# Patient Record
Sex: Female | Born: 2000 | Race: Black or African American | Hispanic: No | Marital: Single | Smoking: Never smoker
Health system: Southern US, Community
[De-identification: ages and names within clinical notes are randomized; demographics above are authoritative.]

## PROBLEM LIST (undated history)

## (undated) DIAGNOSIS — N83209 Unspecified ovarian cyst, unspecified side: Secondary | ICD-10-CM

## (undated) DIAGNOSIS — A749 Chlamydial infection, unspecified: Secondary | ICD-10-CM

## (undated) DIAGNOSIS — D649 Anemia, unspecified: Secondary | ICD-10-CM

## (undated) DIAGNOSIS — A599 Trichomoniasis, unspecified: Secondary | ICD-10-CM

## (undated) DIAGNOSIS — Z202 Contact with and (suspected) exposure to infections with a predominantly sexual mode of transmission: Secondary | ICD-10-CM

## (undated) HISTORY — PX: NO PAST SURGERIES: SHX2092

## (undated) HISTORY — PX: WISDOM TOOTH EXTRACTION: SHX21

---
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
## 2018-06-09 ENCOUNTER — Other Ambulatory Visit: Payer: Self-pay

## 2018-06-09 ENCOUNTER — Ambulatory Visit (HOSPITAL_COMMUNITY)
Admission: EM | Admit: 2018-06-09 | Discharge: 2018-06-09 | Disposition: A | Discharge status: Home / Self Care | Payer: Self-pay | Attending: Family Medicine | Admitting: Family Medicine

## 2018-06-09 ENCOUNTER — Encounter (HOSPITAL_COMMUNITY): Payer: Self-pay

## 2018-06-09 DIAGNOSIS — R059 Cough, unspecified: Secondary | ICD-10-CM

## 2018-06-09 DIAGNOSIS — R05 Cough: Secondary | ICD-10-CM | POA: Insufficient documentation

## 2018-06-09 DIAGNOSIS — R0981 Nasal congestion: Secondary | ICD-10-CM | POA: Insufficient documentation

## 2018-06-09 MED ORDER — CETIRIZINE HCL 10 MG PO TABS: 10.0000 mg | ORAL_TABLET | Freq: Every day | ORAL | 0 refills | Status: DC

## 2018-06-09 MED ORDER — AZITHROMYCIN 250 MG PO TABS: 250.0000 mg | ORAL_TABLET | Freq: Every day | ORAL | 0 refills | Status: DC

## 2018-06-09 NOTE — ED Triage Notes (Signed)
Pt cc cough and chest congestion . Pt states her eyes hurt. This has been going on for a month or more.

## 2018-06-10 NOTE — ED Provider Notes (Signed)
[facility]   [id number] 06/09/18 Arrival Time: 1722  ASSESSMENT & PLAN:  1. Cough   2. Nasal congestion     Meds ordered this encounter  Medications  . azithromycin (ZITHROMAX) 250 MG tablet    Sig: Take 1 tablet (250 mg total) by mouth daily. Take first 2 tablets together, then 1 every day until finished.    Dispense:  6 tablet    Refill:  0  . cetirizine (ZYRTEC) 10 MG tablet    Sig: Take 1 tablet (10 mg total) by mouth daily.    Dispense:  30 tablet    Refill:  0   Given duration of symptoms,will treat as above. OTC symptom care as needed. Ensure adequate fluid intake and rest.  Follow-up Information    [facility].   Specialty:  Urgent Care Why:  As needed. Contact information: [address] [place] [address] [phone]          Reviewed expectations re: course of current medical issues. Questions answered. Outlined signs and symptoms indicating need for more acute intervention. Patient verbalized understanding. After Visit Summary given.   SUBJECTIVE: History from: patient.  Mary Vincent is a 18 y.o. female who presents with complaint of nasal congestion, post-nasal drainage, and sinus pain. Also reports a persistent dry cough. Onset gradual, about a month ago. Fever: no. Overall normal PO intake without n/v. OTC treatment: various cold medicines without relief.. History of frequent sinus infections: no. No specific aggravating or alleviating factors reported. Social History   Tobacco Use  Smoking Status Former Smoker  . Last attempt to quit: 10/06/2012  . Years since quitting: 5.6  Smokeless Tobacco Never Used    ROS: As per HPI.   OBJECTIVE:  Vitals:   06/09/18 1820  BP: 104/67  Pulse: 99  Resp: 16  Temp: 98.2 F (36.8 C)  SpO2: 100%  Weight: 59 kg     General appearance: alert; appears fatigued HEENT: nasal congestion; clear runny nose; throat irritation secondary  to post-nasal drainage; bilateral maxillary tenderness to palpation; turbinates boggy Neck: supple without LAD; trachea midline Lungs: unlabored respirations, symmetrical air entry; cough: moderate and dry; no respiratory distress Skin: warm and dry Psychological: alert and cooperative; normal mood and affect  No Known Allergies  Past Medical History:  Diagnosis Date  . Chlamydia contact, treated    History reviewed. No pertinent family history. Social History   Socioeconomic History  . Marital status: Single    Spouse name: Not on file  . Number of children: Not on file  . Years of education: Not on file  . Highest education level: Not on file  Occupational History  . Not on file  Social Needs  . Financial resource strain: Not on file  . Food insecurity:    Worry: Not on file    Inability: Not on file  . Transportation needs:    Medical: Not on file    Non-medical: Not on file  Tobacco Use  . Smoking status: Former Smoker    Last attempt to quit: 10/06/2012    Years since quitting: 5.6  . Smokeless tobacco: Never Used  Substance and Sexual Activity  . Alcohol use: No  . Drug use: Yes    Types: Marijuana    Comment: last use 8 months ago  . Sexual activity: Yes    Birth control/protection: Abstinence  Lifestyle  . Physical activity:    Days per week: Not on file  Minutes per session: Not on file  . Stress: Not on file  Relationships  . Social connections:    Talks on phone: Not on file    Gets together: Not on file    Attends religious service: Not on file    Active member of club or organization: Not on file    Attends meetings of clubs or organizations: Not on file    Relationship status: Not on file  . Intimate partner violence:    Fear of current or ex partner: Not on file    Emotionally abused: Not on file    Physically abused: Not on file    Forced sexual activity: Not on file  Other Topics Concern  . Not on file  Social History Narrative  . Not  on file            Mardella Layman, MD 06/10/18 [id number]

## 2019-03-15 ENCOUNTER — Encounter (HOSPITAL_COMMUNITY): Payer: Self-pay | Admitting: Emergency Medicine

## 2019-03-15 ENCOUNTER — Other Ambulatory Visit: Payer: Self-pay

## 2019-03-15 ENCOUNTER — Ambulatory Visit (HOSPITAL_COMMUNITY)
Admission: EM | Admit: 2019-03-15 | Discharge: 2019-03-15 | Disposition: A | Discharge status: Home / Self Care | Payer: Medicaid Other | Attending: Family Medicine | Admitting: Family Medicine

## 2019-03-15 DIAGNOSIS — M545 Low back pain, unspecified: Secondary | ICD-10-CM

## 2019-03-15 DIAGNOSIS — M25571 Pain in right ankle and joints of right foot: Secondary | ICD-10-CM

## 2019-03-15 MED ORDER — IBUPROFEN 800 MG PO TABS: 800.0000 mg | ORAL_TABLET | Freq: Three times a day (TID) | ORAL | 0 refills | Status: DC

## 2019-03-15 MED ORDER — TIZANIDINE HCL 4 MG PO TABS: 4.0000 mg | ORAL_TABLET | Freq: Four times a day (QID) | ORAL | 0 refills | Status: DC | PRN
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
## 2019-03-15 NOTE — Discharge Instructions (Signed)
Take ibuprofen 3 times a day with food for any muscular pain Take muscle relaxer as needed.  Be sure you take 1 before you go to bed tonight Ice or heat to painful areas Activity as tolerated Stay home tomorrow if you wake up feeling stiff and sore Expect improvement over the next several days to week

## 2019-03-15 NOTE — ED Notes (Signed)
No answer
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
## 2019-05-12 ENCOUNTER — Telehealth: Payer: Medicaid Other | Admitting: Family

## 2019-05-12 DIAGNOSIS — B9689 Other specified bacterial agents as the cause of diseases classified elsewhere: Secondary | ICD-10-CM

## 2019-05-12 DIAGNOSIS — N760 Acute vaginitis: Secondary | ICD-10-CM | POA: Diagnosis not present

## 2019-05-12 MED ORDER — METRONIDAZOLE 500 MG PO TABS: 500.0000 mg | ORAL_TABLET | Freq: Two times a day (BID) | ORAL | 0 refills | Status: DC

## 2019-05-12 NOTE — Progress Notes (Signed)

## 2019-06-19 ENCOUNTER — Other Ambulatory Visit: Payer: Self-pay

## 2019-06-19 ENCOUNTER — Encounter (HOSPITAL_COMMUNITY): Payer: Self-pay

## 2019-06-19 ENCOUNTER — Ambulatory Visit (HOSPITAL_COMMUNITY)
Admission: EM | Admit: 2019-06-19 | Discharge: 2019-06-19 | Disposition: A | Discharge status: Home / Self Care | Payer: Medicaid Other | Attending: Family Medicine | Admitting: Family Medicine

## 2019-06-19 DIAGNOSIS — T23201A Burn of second degree of right hand, unspecified site, initial encounter: Secondary | ICD-10-CM

## 2019-06-19 NOTE — ED Notes (Signed)
No answer

## 2019-06-19 NOTE — ED Triage Notes (Signed)
Pt states she has a burn on her right wrist at home cooking chicken. Pt needs a work note.
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
## 2019-06-27 ENCOUNTER — Telehealth: Payer: Medicaid Other | Admitting: Family

## 2019-06-27 DIAGNOSIS — N760 Acute vaginitis: Secondary | ICD-10-CM

## 2019-06-27 DIAGNOSIS — B9689 Other specified bacterial agents as the cause of diseases classified elsewhere: Secondary | ICD-10-CM

## 2019-06-27 MED ORDER — METRONIDAZOLE 500 MG PO TABS: 500.0000 mg | ORAL_TABLET | Freq: Two times a day (BID) | ORAL | 0 refills | Status: DC

## 2019-06-27 NOTE — Progress Notes (Signed)
We are sorry that you are not feeling well. Here is how we plan to help! Based on what you shared with me it looks like you: May have a vaginosis due to bacteria  Vaginosis is an inflammation of the vagina that can result in discharge, itching and pain. The cause is usually a change in the normal balance of vaginal bacteria or an infection. Vaginosis can also result from reduced estrogen levels after menopause.  The most common causes of vaginosis are:   Bacterial vaginosis which results from an overgrowth of one on several organisms that are normally present in your vagina.   Yeast infections which are caused by a naturally occurring fungus called candida.   Vaginal atrophy (atrophic vaginosis) which results from the thinning of the vagina from reduced estrogen levels after menopause.   Trichomoniasis which is caused by a parasite and is commonly transmitted by sexual intercourse.  Factors that increase your risk of developing vaginosis include: Marland Kitchen Medications, such as antibiotics and steroids . Uncontrolled diabetes . Use of hygiene products such as bubble bath, vaginal spray or vaginal deodorant . Douching . Wearing damp or tight-fitting clothing . Using an intrauterine device (IUD) for birth control . Hormonal changes, such as those associated with pregnancy, birth control pills or menopause . Sexual activity . Having a sexually transmitted infection  Your treatment plan is Metronidazole or Flagyl 500mg  twice a day for 7 days.  I have electronically sent this prescription into the pharmacy that you have chosen.   If your symptoms return or do not improve you will need to be face to face.   Be sure to take all of the medication as directed. Stop taking any medication if you develop a rash, tongue swelling or shortness of breath. Mothers who are breast feeding should consider pumping and discarding their breast milk while on these antibiotics. However, there is no consensus that infant  exposure at these doses would be harmful.  Remember that medication creams can weaken latex condoms. Marland Kitchen   HOME CARE:  Good hygiene may prevent some types of vaginosis from recurring and may relieve some symptoms:  . Avoid baths, hot tubs and whirlpool spas. Rinse soap from your outer genital area after a shower, and dry the area well to prevent irritation. Don't use scented or harsh soaps, such as those with deodorant or antibacterial action. Marland Kitchen Avoid irritants. These include scented tampons and pads. . Wipe from front to back after using the toilet. Doing so avoids spreading fecal bacteria to your vagina.  Other things that may help prevent vaginosis include:  Marland Kitchen Don't douche. Your vagina doesn't require cleansing other than normal bathing. Repetitive douching disrupts the normal organisms that reside in the vagina and can actually increase your risk of vaginal infection. Douching won't clear up a vaginal infection. . Use a latex condom. Both female and female latex condoms may help you avoid infections spread by sexual contact. . Wear cotton underwear. Also wear pantyhose with a cotton crotch. If you feel comfortable without it, skip wearing underwear to bed. Yeast thrives in [employer] Your symptoms should improve in the next day or two.  GET HELP RIGHT AWAY IF:  . You have pain in your lower abdomen ( pelvic area or over your ovaries) . You develop nausea or vomiting . You develop a fever . Your discharge changes or worsens . You have persistent pain with intercourse . You develop shortness of breath, a rapid pulse, or you faint.  These  symptoms could be signs of problems or infections that need to be evaluated by a medical provider now.  MAKE SURE YOU    Understand these instructions.  Will watch your condition.  Will get help right away if you are not doing well or get worse.  Your e-visit answers were reviewed by a board certified advanced clinical practitioner to  complete your personal care plan. Depending upon the condition, your plan could have included both over the counter or prescription medications. Please review your pharmacy choice to make sure that you have choses a pharmacy that is open for you to pick up any needed prescription, Your safety is important to [place]. If you have drug allergies check your prescription carefully.   You can use MyChart to ask questions about today's visit, request a non-urgent call back, or ask for a work or school excuse for 24 hours related to this e-Visit. If it has been greater than 24 hours you will need to follow up with your provider, or enter a new e-Visit to address those concerns. You will get a MyChart message within the next two days asking about your experience. I hope that your e-visit has been valuable and will speed your recovery.

## 2019-07-11 ENCOUNTER — Other Ambulatory Visit: Payer: Self-pay | Admitting: Family

## 2019-07-11 NOTE — Progress Notes (Signed)
Approximately 5 minutes was spent documenting and reviewing patient's chart.   

## 2019-09-25 ENCOUNTER — Telehealth: Payer: Medicaid Other | Admitting: Physician Assistant

## 2019-09-25 DIAGNOSIS — R05 Cough: Secondary | ICD-10-CM | POA: Diagnosis not present

## 2019-09-25 DIAGNOSIS — R0981 Nasal congestion: Secondary | ICD-10-CM

## 2019-09-25 DIAGNOSIS — J029 Acute pharyngitis, unspecified: Secondary | ICD-10-CM

## 2019-09-25 DIAGNOSIS — R059 Cough, unspecified: Secondary | ICD-10-CM

## 2019-09-25 MED ORDER — BENZONATATE 100 MG PO CAPS: 100.0000 mg | ORAL_CAPSULE | Freq: Three times a day (TID) | ORAL | 0 refills | Status: DC | PRN

## 2019-09-25 MED ORDER — PHENOL 1.4 % MT LIQD: 1.0000 | OROMUCOSAL | 0 refills | Status: DC | PRN

## 2019-09-25 MED ORDER — FLUTICASONE PROPIONATE 50 MCG/ACT NA SUSP: 2.0000 | Freq: Every day | NASAL | 0 refills | Status: DC

## 2019-09-25 NOTE — Progress Notes (Signed)
We are sorry you are not feeling well.  Here is how we plan to help!  Based on what you have shared with me, it looks like you may have a viral upper respiratory infection.  Upper respiratory infections are caused by a large number of viruses; however, rhinovirus is the most common cause.   Symptoms vary from person to person, with common symptoms including sore throat, cough, fatigue or lack of energy and feeling of general discomfort.  A low-grade fever of up to 100.4 may present, but is often uncommon.  Symptoms vary however, and are closely related to a person's age or underlying illnesses.  The most common symptoms associated with an upper respiratory infection are nasal discharge or congestion, cough, sneezing, headache and pressure in the ears and face.  These symptoms usually persist for about 3 to 10 days, but can last up to 2 weeks.  It is important to know that upper respiratory infections do not cause serious illness or complications in most cases.    Upper respiratory infections can be transmitted from person to person, with the most common method of transmission being a person's hands.  The virus is able to live on the skin and can infect other persons for up to 2 hours after direct contact.  Also, these can be transmitted when someone coughs or sneezes; thus, it is important to cover the mouth to reduce this risk.  To keep the spread of the illness at [place], good hand hygiene is very important.  This is an infection that is most likely caused by a virus. There are no specific treatments other than to help you with the symptoms until the infection runs its course.  We are sorry you are not feeling well.  Here is how we plan to help!   For nasal congestion, you may use an oral decongestants such as Mucinex D or if you have glaucoma or high blood pressure use plain Mucinex.  Saline nasal spray or nasal drops can help and can safely be used as often as needed for congestion.  For your congestion,  I have prescribed Fluticasone nasal spray one spray in each nostril twice a day  If you do not have a history of heart disease, hypertension, diabetes or thyroid disease, prostate/bladder issues or glaucoma, you may also use Sudafed to treat nasal congestion.  It is highly recommended that you consult with a pharmacist or your primary care physician to ensure this medication is safe for you to take.     If you have a cough, you may use cough suppressants such as Delsym and Robitussin.  If you have glaucoma or high blood pressure, you can also use Coricidin HBP.   For cough I have prescribed for you A prescription cough medication called Tessalon Perles 100 mg. You may take 1-2 capsules every 8 hours as needed for cough  For sore or scratchy throat, use a saltwater gargle-  to  teaspoon of salt dissolved in a 4-ounce to 8-ounce glass of warm water.  Gargle the solution for approximately 15-30 seconds and then spit.  It is important not to swallow the solution.    You can also use throat lozenges/cough drops and Chloraseptic spray (which I have prescribed for you) to help with throat pain or discomfort.  Warm or cold liquids can also be helpful in relieving throat pain.  For headache, pain or general discomfort, you can use Ibuprofen or Tylenol as directed.   Some authorities believe that zinc  sprays or the use of Echinacea may shorten the course of your symptoms.   HOME CARE . Only take medications as instructed by your medical team. . Be sure to drink plenty of fluids. Water is fine as well as fruit juices, sodas and electrolyte beverages. You may want to stay away from caffeine or alcohol. If you are nauseated, try taking small sips of liquids. How do you know if you are getting enough fluid? Your urine should be a pale yellow or almost colorless. . Get rest. . Taking a steamy shower or using a humidifier may help nasal congestion and ease sore throat pain. You can place a towel over your head  and breathe in the steam from hot water coming from a faucet. . Using a saline nasal spray works much the same way. . Cough drops, hard candies and sore throat lozenges may ease your cough. . Avoid close contacts especially the very young and the elderly . Cover your mouth if you cough or sneeze . Always remember to wash your hands.   GET HELP RIGHT AWAY IF: . You develop worsening fever. . If your symptoms do not improve within 10 days . You develop yellow or green discharge from your nose over 3 days. . You have coughing fits . You develop a severe head ache or visual changes. . You develop shortness of breath, difficulty breathing or start having chest pain . Your symptoms persist after you have completed your treatment plan  MAKE SURE YOU   Understand these instructions.  Will watch your condition.  Will get help right away if you are not doing well or get worse.  Your e-visit answers were reviewed by a board certified advanced clinical practitioner to complete your personal care plan. Depending upon the condition, your plan could have included both over the counter or prescription medications. Please review your pharmacy choice. If there is a problem, you may call our nursing hot line at and have the prescription routed to another pharmacy. Your safety is important to [place]. If you have drug allergies check your prescription carefully.   You can use MyChart to ask questions about today's visit, request a non-urgent call back, or ask for a work or school excuse for 24 hours related to this e-Visit. If it has been greater than 24 hours you will need to follow up with your provider, or enter a new e-Visit to address those concerns. You will get an e-mail in the next two days asking about your experience.  I hope that your e-visit has been valuable and will speed your recovery. Thank you for using e-visits.  Greater than 5 minutes, yet less than 10 minutes of time have been spent  researching, coordinating, and implementing care for this patient today.

## 2019-10-29 ENCOUNTER — Telehealth: Payer: Medicaid Other

## 2019-11-20 ENCOUNTER — Telehealth: Payer: Medicaid Other | Admitting: Nurse Practitioner

## 2019-11-20 DIAGNOSIS — B9689 Other specified bacterial agents as the cause of diseases classified elsewhere: Secondary | ICD-10-CM

## 2019-11-20 DIAGNOSIS — N760 Acute vaginitis: Secondary | ICD-10-CM

## 2019-11-20 MED ORDER — METRONIDAZOLE 500 MG PO TABS: 500.0000 mg | ORAL_TABLET | Freq: Two times a day (BID) | ORAL | 0 refills | Status: DC

## 2019-11-20 NOTE — Progress Notes (Signed)

## 2020-05-04 ENCOUNTER — Telehealth: Payer: Medicaid Other | Admitting: Physician Assistant

## 2020-05-04 ENCOUNTER — Encounter: Payer: Self-pay | Admitting: Physician Assistant

## 2020-05-04 DIAGNOSIS — R21 Rash and other nonspecific skin eruption: Secondary | ICD-10-CM

## 2020-05-04 MED ORDER — TRIAMCINOLONE ACETONIDE 0.1 % EX CREA: 1.0000 "application " | TOPICAL_CREAM | Freq: Two times a day (BID) | CUTANEOUS | 0 refills | Status: DC

## 2020-05-04 NOTE — Progress Notes (Signed)
E Visit for Rash  We are sorry that you are not feeling well. Here is how we plan to help!  I have prescribed steroid cream, triamcinolone 0.1%  Apply thin layer twice daily for 5 days. Avoid overuse of the cream to avoid skin thinning. HOME CARE:   Take cool showers and avoid direct sunlight.  Apply cool compress or wet dressings.  Take a bath in an oatmeal bath.  Sprinkle content of one Aveeno packet under running faucet with comfortably warm water.  Bathe for 15-20 minutes, 1-2 times daily.  Pat dry with a towel. Do not rub the rash.  Use hydrocortisone cream.  Take an antihistamine like Benadryl for widespread rashes that itch.  The adult dose of Benadryl is 25-50 mg by mouth 4 times daily.  Caution:  This type of medication may cause sleepiness.  Do not drink alcohol, drive, or operate dangerous machinery while taking antihistamines.  Do not take these medications if you have prostate enlargement.  Read package instructions thoroughly on all medications that you take.  GET HELP RIGHT AWAY IF:   Symptoms don't go away after treatment.  Severe itching that persists.  If you rash spreads or swells.  If you rash begins to smell.  If it blisters and opens or develops a yellow-brown crust.  You develop a fever.  You have a sore throat.  You become short of breath.  MAKE SURE YOU:  Understand these instructions. Will watch your condition. Will get help right away if you are not doing well or get worse.  Thank you for choosing an e-visit. Your e-visit answers were reviewed by a board certified advanced clinical practitioner to complete your personal care plan. Depending upon the condition, your plan could have included both over the counter or prescription medications. Please review your pharmacy choice. Be sure that the pharmacy you have chosen is open so that you can pick up your prescription now.  If there is a problem you may message your provider in MyChart to have  the prescription routed to another pharmacy. Your safety is important to [place]. If you have drug allergies check your prescription carefully.  For the next 24 hours, you can use MyChart to ask questions about today's visit, request a non-urgent call back, or ask for a work or school excuse from your e-visit provider. You will get an email in the next two days asking about your experience. I hope that your e-visit has been valuable and will speed your recovery.     I spent 5-10 minutes on review and completion of this note- Illa Level [facility]

## 2020-05-12 ENCOUNTER — Inpatient Hospital Stay (HOSPITAL_COMMUNITY)
Admission: AD | Admit: 2020-05-12 | Discharge: 2020-05-12 | Disposition: A | Discharge status: Home / Self Care | Payer: Medicaid Other | Attending: Family Medicine | Admitting: Family Medicine

## 2020-05-12 ENCOUNTER — Other Ambulatory Visit: Payer: Self-pay

## 2020-05-12 ENCOUNTER — Inpatient Hospital Stay (HOSPITAL_COMMUNITY): Payer: Medicaid Other

## 2020-05-12 ENCOUNTER — Encounter (HOSPITAL_COMMUNITY): Payer: Self-pay | Admitting: Obstetrics and Gynecology

## 2020-05-12 DIAGNOSIS — R102 Pelvic and perineal pain: Secondary | ICD-10-CM

## 2020-05-12 DIAGNOSIS — O26899 Other specified pregnancy related conditions, unspecified trimester: Secondary | ICD-10-CM

## 2020-05-12 DIAGNOSIS — Z3A01 Less than 8 weeks gestation of pregnancy: Secondary | ICD-10-CM | POA: Insufficient documentation

## 2020-05-12 DIAGNOSIS — R109 Unspecified abdominal pain: Secondary | ICD-10-CM | POA: Insufficient documentation

## 2020-05-12 DIAGNOSIS — Z87891 Personal history of nicotine dependence: Secondary | ICD-10-CM | POA: Diagnosis not present

## 2020-05-12 DIAGNOSIS — N898 Other specified noninflammatory disorders of vagina: Secondary | ICD-10-CM | POA: Diagnosis not present

## 2020-05-12 DIAGNOSIS — O26891 Other specified pregnancy related conditions, first trimester: Secondary | ICD-10-CM | POA: Insufficient documentation

## 2020-05-12 DIAGNOSIS — Z3490 Encounter for supervision of normal pregnancy, unspecified, unspecified trimester: Secondary | ICD-10-CM

## 2020-05-12 HISTORY — DX: Trichomoniasis, unspecified: A59.9

## 2020-05-12 HISTORY — DX: Anemia, unspecified: D64.9

## 2020-05-12 HISTORY — DX: Chlamydial infection, unspecified: A74.9

## 2020-05-12 HISTORY — DX: Unspecified ovarian cyst, unspecified side: N83.209

## 2020-05-12 LAB — URINALYSIS, ROUTINE W REFLEX MICROSCOPIC
Bilirubin Urine: NEGATIVE
Glucose, UA: NEGATIVE mg/dL
Hgb urine dipstick: NEGATIVE
Ketones, ur: 20 mg/dL — AB
Leukocytes,Ua: NEGATIVE
Nitrite: NEGATIVE
Protein, ur: NEGATIVE mg/dL
Specific Gravity, Urine: 1.026 (ref 1.005–1.030)
pH: 5 (ref 5.0–8.0)

## 2020-05-12 LAB — CBC
HCT: 34.6 % — ABNORMAL LOW (ref 36.0–46.0)
Hemoglobin: 11.4 g/dL — ABNORMAL LOW (ref 12.0–15.0)
MCH: 23.8 pg — ABNORMAL LOW (ref 26.0–34.0)
MCHC: 32.9 g/dL (ref 30.0–36.0)
MCV: 72.2 fL — ABNORMAL LOW (ref 80.0–100.0)
Platelets: 253 10*3/uL (ref 150–400)
RBC: 4.79 MIL/uL (ref 3.87–5.11)
RDW: 17.3 % — ABNORMAL HIGH (ref 11.5–15.5)
WBC: 5.6 10*3/uL (ref 4.0–10.5)
nRBC: 0 % (ref 0.0–0.2)

## 2020-05-12 LAB — HCG, QUANTITATIVE, PREGNANCY: hCG, Beta Chain, Quant, S: 8407 m[IU]/mL — ABNORMAL HIGH (ref <5)

## 2020-05-12 LAB — WET PREP, GENITAL
Sperm: NONE SEEN
Trich, Wet Prep: NONE SEEN
WBC, Wet Prep HPF POC: NONE SEEN
Yeast Wet Prep HPF POC: NONE SEEN

## 2020-05-12 LAB — ABO/RH: ABO/RH(D): O POS

## 2020-05-12 LAB — HIV ANTIBODY (ROUTINE TESTING W REFLEX): HIV Screen 4th Generation wRfx: NONREACTIVE

## 2020-05-12 LAB — POCT PREGNANCY, URINE: Preg Test, Ur: POSITIVE — AB

## 2020-05-12 MED ORDER — PREPLUS 27-1 MG PO TABS: 1.0000 | ORAL_TABLET | Freq: Every day | ORAL | 13 refills | Status: DC

## 2020-05-12 NOTE — Discharge Instructions (Signed)
Return to MAU:  If you have heavy bleeding that soaks through more that 2 pads per hour for an hour or more  If you bleed so much that you feel like you might pass out or you do pass out  If you have significant abdominal pain that is not improved with Tylenol 1000 mg every 6 hours as needed for pain  If you develop a fever > 100.5  Someone from [facility] ultrasound department will call you to schedule repeat u/s in 2 wks.  Safe Medications in Pregnancy   Acne: Benzoyl Peroxide Salicylic Acid  Backache/Headache: Tylenol: 2 regular strength every 4 hours OR              2 Extra strength every 6 hours  Colds/Coughs/Allergies: Benadryl (alcohol free) 25 mg every 6 hours as needed Breath right strips Claritin Cepacol throat lozenges Chloraseptic throat spray Cold-Eeze- up to three times per day Cough drops, alcohol free Flonase (by prescription only) Guaifenesin Mucinex Robitussin DM (plain only, alcohol free) Saline nasal spray/drops Sudafed (pseudoephedrine) & Actifed ** use only after [redacted] weeks gestation and if you do not have high blood pressure Tylenol Vicks Vaporub Zinc lozenges Zyrtec   Constipation: Colace Ducolax suppositories Fleet enema Glycerin suppositories Metamucil Milk of magnesia Miralax Senokot Smooth move tea  Diarrhea: Kaopectate Imodium A-D  *NO pepto Bismol  Hemorrhoids: Anusol Anusol HC Preparation H Tucks  Indigestion: Tums Maalox Mylanta Zantac  Pepcid  Insomnia: Benadryl (alcohol free) 25mg  every 6 hours as needed Tylenol PM Unisom, no Gelcaps  Leg Cramps: Tums MagGel  Nausea/Vomiting:  Bonine Dramamine Emetrol Ginger extract Sea bands Meclizine  Nausea medication to take during pregnancy:  Unisom (doxylamine succinate 25 mg tablets) Take one tablet daily at bedtime. If symptoms are not adequately controlled, the dose can be increased to a maximum recommended dose of two tablets daily (1/2 tablet  in the morning, 1/2 tablet mid-afternoon and one at bedtime). Vitamin B6 100mg  tablets. Take one tablet twice a day (up to 200 mg per day).  Skin Rashes: Aveeno products Benadryl cream or 25mg  every 6 hours as needed Calamine Lotion 1% cortisone cream  Yeast infection: Gyne-lotrimin 7 Monistat 7   **If taking multiple medications, please check labels to avoid duplicating the same active ingredients **take medication as directed on the label ** Do not exceed 4000 mg of tylenol in 24 hours **Do not take medications that contain aspirin or ibuprofen

## 2020-05-12 NOTE — MAU Note (Signed)
+  HPT yesterday, has been drinking and stuff, wants to know if everything is ok with the baby.  Has been cramping in the stomach every day.

## 2020-05-12 NOTE — MAU Provider Note (Signed)
History     CSN: [id number]  Arrival date and time: 05/12/20 1300   First Provider Initiated Contact with Patient 05/12/20 1746      Chief Complaint  Patient presents with  . Abdominal Pain  . Possible Pregnancy   Ms. Mary Vincent is a 19 y.o. year old G72P0000 female at [redacted]w[redacted]d weeks gestation who presents to MAU reporting (+) HPT and daily abdominal cramping. She reports she has been "drinking and stuff, so I wanted to make sure the baby is ok." She denies VB, but reports some vaginal discharge.   OB History    Gravida  1   Para  0   Term  0   Preterm  0   AB  0   Living  0     SAB  0   TAB  0   Ectopic  0   Multiple  0   Live Births              Past Medical History:  Diagnosis Date  . Anemia   . Chlamydia   . Chlamydia contact, treated   . Ovarian cyst   . Trichomonas infection     Past Surgical History:  Procedure Laterality Date  . NO PAST SURGERIES    . WISDOM TOOTH EXTRACTION      Family History  Problem Relation Age of Onset  . Healthy Mother   . Healthy Father     Social History   Tobacco Use  . Smoking status: Former Smoker    Quit date: 10/06/2012    Years since quitting: 7.6  . Smokeless tobacco: Never Used  Vaping Use  . Vaping Use: Never used  Substance Use Topics  . Alcohol use: Yes    Comment: weekends  . Drug use: Yes    Frequency: 7.0 times per week    Types: Marijuana    Comment: last was 12/4    Allergies: No Known Allergies  Medications Prior to Admission  Medication Sig Dispense Refill Last Dose  . benzonatate (TESSALON) 100 MG capsule Take 1 capsule (100 mg total) by mouth 3 (three) times daily as needed for cough. 21 capsule 0   . fluticasone (FLONASE) 50 MCG/ACT nasal spray Place 2 sprays into both nostrils daily. 16 g 0   . ibuprofen (ADVIL) 800 MG tablet Take 1 tablet (800 mg total) by mouth 3 (three) times daily. 21 tablet 0   . metroNIDAZOLE (FLAGYL) 500 MG tablet Take 1 tablet (500 mg total) by  mouth 2 (two) times daily. 14 tablet 0   . phenol (CHLORASEPTIC) 1.4 % LIQD Use as directed 1 spray in the mouth or throat as needed for throat irritation / pain. 29 mL 0   . tiZANidine (ZANAFLEX) 4 MG tablet Take 1-2 tablets (4-8 mg total) by mouth every 6 (six) hours as needed for muscle spasms. 21 tablet 0   . triamcinolone (KENALOG) 0.1 % Apply 1 application topically 2 (two) times daily. 30 g 0     Review of Systems  Constitutional: Negative.   HENT: Negative.   Eyes: Negative.   Respiratory: Negative.   Cardiovascular: Negative.   Gastrointestinal: Negative.   Endocrine: Negative.   Genitourinary: Positive for pelvic pain and vaginal discharge.  Musculoskeletal: Negative.   Skin: Negative.   Allergic/Immunologic: Negative.   Neurological: Negative.   Hematological: Negative.   Psychiatric/Behavioral: Negative.    Physical Exam   Blood pressure 118/69, pulse 60, temperature 97.9 F (36.6 C), temperature source Oral,  resp. rate 16, height 5\' 5"  (1.651 m), weight 57.3 kg, last menstrual period 04/09/2020, SpO2 100 %.  Physical Exam Vitals and nursing note reviewed. Exam conducted with a chaperone present.  Constitutional:      Appearance: Normal appearance. She is normal weight.  HENT:     Head: Normocephalic and atraumatic.  Cardiovascular:     Rate and Rhythm: Normal rate.     Pulses: Normal pulses.  Pulmonary:     Effort: Pulmonary effort is normal.  Abdominal:     General: Abdomen is flat.     Palpations: Abdomen is soft.  Genitourinary:    Comments: Uterus: non-tender, SE: cervix is smooth, pink, no lesions, moderate amt of thin, white vaginal d/c -- WP, GC/CT done, closed/long/firm, no CMT or friability, no adnexal tenderness  Musculoskeletal:        General: Normal range of motion.     Cervical back: Normal range of motion.  Skin:    General: Skin is warm and dry.  Neurological:     Mental Status: She is alert and oriented to person, place, and time.   Psychiatric:        Mood and Affect: Mood normal.        Behavior: Behavior normal.        Thought Content: Thought content normal.        Judgment: Judgment normal.    MAU Course  Procedures  MDM CCUA UPT CBC ABO/Rh HCG Wet Prep GC/CT -- pending HIV -- pending OB < 14 wks 13/08/2019 with TV  Results for orders placed or performed during the hospital encounter of 05/12/20 (from the past 24 hour(s))  Urinalysis, Routine w reflex microscopic Urine, Clean Catch     Status: Abnormal   Collection Time: 05/12/20  2:49 PM  Result Value Ref Range   Color, Urine YELLOW YELLOW   APPearance HAZY (A) CLEAR   Specific Gravity, Urine 1.026 1.005 - 1.030   pH 5.0 5.0 - 8.0   Glucose, UA NEGATIVE NEGATIVE mg/dL   Hgb urine dipstick NEGATIVE NEGATIVE   Bilirubin Urine NEGATIVE NEGATIVE   Ketones, ur 20 (A) NEGATIVE mg/dL   Protein, ur NEGATIVE NEGATIVE mg/dL   Nitrite NEGATIVE NEGATIVE   Leukocytes,Ua NEGATIVE NEGATIVE  Pregnancy, urine POC     Status: Abnormal   Collection Time: 05/12/20  2:53 PM  Result Value Ref Range   Preg Test, Ur POSITIVE (A) NEGATIVE  ABO/Rh     Status: None   Collection Time: 05/12/20  3:39 PM  Result Value Ref Range   ABO/RH(D) O POS    No rh immune globuloin      NOT A RH IMMUNE GLOBULIN CANDIDATE, PT RH POSITIVE Performed at [facility], [address] [place], [place]   CBC     Status: Abnormal   Collection Time: 05/12/20  3:40 PM  Result Value Ref Range   WBC 5.6 4.0 - 10.5 K/uL   RBC 4.79 3.87 - 5.11 MIL/uL   Hemoglobin 11.4 (L) 12.0 - 15.0 g/dL   HCT 14/06/21 (L) 36 - 46 %   MCV 72.2 (L) 80.0 - 100.0 fL   MCH 23.8 (L) 26.0 - 34.0 pg   MCHC 32.9 30.0 - 36.0 g/dL   RDW 52.8 (H) 41.3 - 24.4 %   Platelets 253 150 - 400 K/uL   nRBC 0.0 0.0 - 0.2 %  hCG, quantitative, pregnancy     Status: Abnormal   Collection Time: 05/12/20  3:40  PM  Result Value Ref Range   hCG, Beta Chain, Quant, S 8,407 (H) <5 mIU/mL  HIV Antibody (routine  testing w rflx)     Status: None   Collection Time: 05/12/20  3:40 PM  Result Value Ref Range   HIV Screen 4th Generation wRfx Non Reactive Non Reactive  Wet prep, genital     Status: Abnormal   Collection Time: 05/12/20  5:55 PM   Specimen: PATH Cytology Cervicovaginal Ancillary Only  Result Value Ref Range   Yeast Wet Prep HPF POC NONE SEEN NONE SEEN   Trich, Wet Prep NONE SEEN NONE SEEN   Clue Cells Wet Prep HPF POC PRESENT (A) NONE SEEN   WBC, Wet Prep HPF POC NONE SEEN NONE SEEN   Sperm NONE SEEN     US OB LESS THAN 14 WEEKS WITH OB TRANSVAGINAL  Result Date: 05/12/2020 CLINICAL DATA:  Initial evaluation for acute pelvic pain, early pregnancy. EXAM: OBSTETRIC <14 WK [place] AND TRANSVAGINAL OB US TECHNIQUE: Both transabdominal and transvaginal ultrasound examinations were performed for complete evaluation of the gestation as well as the maternal uterus, adnexal regions, and pelvic cul-de-sac. Transvaginal technique was performed to assess early pregnancy. COMPARISON:  None. FINDINGS: Intrauterine gestational sac: Single Yolk sac:  Present Embryo:  Negative. Cardiac Activity: Negative. Heart Rate: N/A MSD: 6.0 mm   5 w   2 d Subchorionic hemorrhage:  None visualized. Maternal uterus/adnexae: Ovaries within normal limits. No adnexal mass. Trace free fluid noted within the pelvic cul-de-sac. IMPRESSION: 1. Early intrauterine gestational sac with internal yolk sac, but no fetal pole or cardiac activity yet visualized. Recommend follow-up quantitative B-HCG levels and follow-up US in 14 days to confirm and assess viability. 2. No other acute maternal uterine or adnexal abnormality identified. Electronically Signed   By: Rise Mu M.D.   On: 05/12/2020 18:30     Assessment and Plan  Intrauterine pregnancy  - US OB Transvaginal to check viability in 2 wks - Information provided on prenatal care    Abdominal cramping affecting pregnancy   - Information provided on abdominal pain in  pregnancy - Advised to take Tylenol 1000 mg every 6 hours prn pain  - Discharge home - Someone will call to schedule rpt U/S in 2 wks - Patient verbalized an understanding of the plan of care and agrees.    Raelyn Mora, CNM 05/12/2020, 5:46 PM

## 2020-05-13 LAB — GC/CHLAMYDIA PROBE AMP (~~LOC~~) NOT AT ARMC
Chlamydia: NEGATIVE
Comment: NEGATIVE
Comment: NORMAL
Neisseria Gonorrhea: NEGATIVE

## 2020-06-04 ENCOUNTER — Other Ambulatory Visit: Payer: Self-pay

## 2020-06-04 ENCOUNTER — Telehealth: Payer: Self-pay | Admitting: Medical

## 2020-06-04 ENCOUNTER — Ambulatory Visit
Admission: RE | Admit: 2020-06-04 | Discharge: 2020-06-04 | Disposition: A | Discharge status: Home / Self Care | Payer: Medicaid Other | Source: Ambulatory Visit | Attending: Obstetrics and Gynecology | Admitting: Obstetrics and Gynecology

## 2020-06-04 DIAGNOSIS — Z3490 Encounter for supervision of normal pregnancy, unspecified, unspecified trimester: Secondary | ICD-10-CM | POA: Insufficient documentation

## 2020-06-04 NOTE — Telephone Encounter (Signed)
I called Mary Vincent today at 8:26 AM and confirmed patient's identity using two patient identifiers. [place] results from earlier today were reviewed. Patient is not yet scheduled for new OB visit. She would like to come to the [facility]. Inbasket message sent to schedule new OB in 2-4 weeks. First trimester warning signs reviewed. Patient voiced understanding and had no further questions.   US OB Transvaginal  Result Date: 06/04/2020 CLINICAL DATA:  19 year old female is pregnant in the 1st trimester. Early intrauterine gestational sac suspected on 05/12/2020. estimated gestational age by LMP 8 weeks 0 days. EXAM: TRANSVAGINAL OB ULTRASOUND TECHNIQUE: Transvaginal ultrasound was performed for complete evaluation of the gestation as well as the maternal uterus, adnexal regions, and pelvic cul-de-sac. COMPARISON:  05/12/2020. FINDINGS: Intrauterine gestational sac: Single Yolk sac:  Visible Embryo:  Visible Cardiac Activity: Detected Heart Rate: 159 bpm CRL:   17.3 mm   8 w 2 d                  [place] EDC: 01/12/2021 Subchorionic hemorrhage:  Trace (image 14). Maternal uterus/adnexae: No pelvic free fluid. Both ovaries appear normal. The right ovary is 3.7 x 2.0 by 3.7 cm with multiple small follicles. The left ovary is 4.1 by 1.8 x 2.1 with several small follicles. IMPRESSION: Single living IUP demonstrated with estimated gestational age by crown-rump length 8 weeks 2 days. Trace subchorionic hemorrhage suspected. Electronically Signed   By: Odessa Fleming M.D.   On: 06/04/2020 08:15    Vonzella Nipple, PA-C 06/04/2020 8:26 AM

## 2020-06-07 NOTE — L&D Delivery Note (Signed)
[facility] Delivery Note  Mary Vincent is a 20 y.o. G1P0000 s/p SVD at [redacted]w[redacted]d. She was admitted for labor.   ROM: 5h 22m with clear fluid GBS Status: Positive, received penicillin Maximum Maternal Temperature: 98.3  Labor Progress: Admitted in labor with SROM after admission and progressed to complete without other augmentation  Delivery Date/Time: 01/01/2021 @0741  Delivery: Called to room and patient was complete and pushing. Head delivered left OA. No nuchal cord present. Shoulder and body delivered in usual fashion. Infant with spontaneous cry, placed on mother's abdomen, dried and stimulated. Cord clamped x 2 after 1-minute delay, and cut by FOB. Cord blood drawn. Placenta delivered spontaneously with gentle cord traction. Fundus firm with massage and Pitocin. Labia, perineum, vagina, and cervix inspected inspected with no lacerations.   Placenta: intact with 3 vessels Complications: none Lacerations: none EBL: 300 Analgesia: epidural  Postpartum Planning [ ]  message to sent to schedule follow-up  [ ]  vaccines UTD  Infant: Female  APGARs 8/9   MD 01/01/2021, 7:51 AM

## 2020-07-01 ENCOUNTER — Other Ambulatory Visit: Payer: Self-pay

## 2020-07-01 ENCOUNTER — Other Ambulatory Visit (HOSPITAL_COMMUNITY)
Admission: RE | Admit: 2020-07-01 | Discharge: 2020-07-01 | Disposition: A | Discharge status: Home / Self Care | Payer: Medicaid Other | Source: Ambulatory Visit | Attending: Family Medicine | Admitting: Family Medicine

## 2020-07-01 ENCOUNTER — Encounter: Payer: Self-pay | Admitting: Family Medicine

## 2020-07-01 ENCOUNTER — Ambulatory Visit (INDEPENDENT_AMBULATORY_CARE_PROVIDER_SITE_OTHER): Payer: Medicaid Other | Admitting: Family Medicine

## 2020-07-01 VITALS — BP 100/60 | HR 74 | Wt 124.8 lb

## 2020-07-01 DIAGNOSIS — Z3401 Encounter for supervision of normal first pregnancy, first trimester: Secondary | ICD-10-CM | POA: Insufficient documentation

## 2020-07-01 DIAGNOSIS — Z3400 Encounter for supervision of normal first pregnancy, unspecified trimester: Secondary | ICD-10-CM | POA: Insufficient documentation

## 2020-07-01 DIAGNOSIS — F913 Oppositional defiant disorder: Secondary | ICD-10-CM | POA: Insufficient documentation

## 2020-07-01 MED ORDER — BLOOD PRESSURE KIT DEVI: 1.0000 | Freq: Once | 0 refills | Status: AC

## 2020-07-01 MED ORDER — PROMETHAZINE HCL 12.5 MG PO TABS: 12.5000 mg | ORAL_TABLET | Freq: Four times a day (QID) | ORAL | 0 refills | Status: DC | PRN

## 2020-07-01 NOTE — Patient Instructions (Signed)
 Second Trimester of Pregnancy  The second trimester of pregnancy is from week 13 through week 27. This is months 4 through 6 of pregnancy. The second trimester is often a time when you feel your best. Your body has adjusted to being pregnant, and you begin to feel better physically. During the second trimester:  Morning sickness has lessened or stopped completely.  You may have more energy.  You may have an increase in appetite. The second trimester is also a time when the unborn baby (fetus) is growing rapidly. At the end of the sixth month, the fetus may be up to 12 inches long and weigh about 1 pounds. You will likely begin to feel the baby move (quickening) between 16 and 20 weeks of pregnancy. Body changes during your second trimester Your body continues to go through many changes during your second trimester. The changes vary and generally return to normal after the baby is born. Physical changes  Your weight will continue to increase. You will notice your lower abdomen bulging out.  You may begin to get stretch marks on your hips, abdomen, and breasts.  Your breasts will continue to grow and to become tender.  Dark spots or blotches (chloasma or mask of pregnancy) may develop on your face.  A dark line from your belly button to the pubic area (linea nigra) may appear.  You may have changes in your hair. These can include thickening of your hair, rapid growth, and changes in texture. Some people also have hair loss during or after pregnancy, or hair that feels dry or thin. Health changes  You may develop headaches.  You may have heartburn.  You may develop constipation.  You may develop hemorrhoids or swollen, bulging veins (varicose veins).  Your gums may bleed and may be sensitive to brushing and flossing.  You may urinate more often because the fetus is pressing on your bladder.  You may have back pain. This is caused by: ? Weight gain. ? Pregnancy hormones  that are relaxing the joints in your pelvis. ? A shift in weight and the muscles that support your balance. Follow these instructions at home: Medicines  Follow your health care provider's instructions regarding medicine use. Specific medicines may be either safe or unsafe to take during pregnancy. Do not take any medicines unless approved by your health care provider.  Take a prenatal vitamin that contains at least 600 micrograms (mcg) of folic acid. Eating and drinking  Eat a healthy diet that includes fresh fruits and vegetables, whole grains, good sources of protein such as meat, eggs, or tofu, and low-fat dairy products.  Avoid raw meat and unpasteurized juice, milk, and cheese. These carry germs that can harm you and your baby.  You may need to take these actions to prevent or treat constipation: ? Drink enough fluid to keep your urine pale yellow. ? Eat foods that are high in fiber, such as beans, whole grains, and fresh fruits and vegetables. ? Limit foods that are high in fat and processed sugars, such as fried or sweet foods. Activity  Exercise only as directed by your health care provider. Most people can continue their usual exercise routine during pregnancy. Try to exercise for 30 minutes at least 5 days a week. Stop exercising if you develop contractions in your uterus.  Stop exercising if you develop pain or cramping in the lower abdomen or lower back.  Avoid exercising if it is very hot or humid or if you are   at a high altitude.  Avoid heavy lifting.  If you choose to, you may have sex unless your health care provider tells you not to. Relieving pain and discomfort  Wear a supportive bra to prevent discomfort from breast tenderness.  Take warm sitz baths to soothe any pain or discomfort caused by hemorrhoids. Use hemorrhoid cream if your health care provider approves.  Rest with your legs raised (elevated) if you have leg cramps or low back pain.  If you develop  varicose veins: ? Wear support hose as told by your health care provider. ? Elevate your feet for 15 minutes, 3-4 times a day. ? Limit salt in your diet. Safety  Wear your seat belt at all times when driving or riding in a car.  Talk with your health care provider if someone is verbally or physically abusive to you. Lifestyle  Do not use hot tubs, steam rooms, or saunas.  Do not douche. Do not use tampons or scented sanitary pads.  Avoid cat litter boxes and soil used by cats. These carry germs that can cause birth defects in the baby and possibly loss of the fetus by miscarriage or stillbirth.  Do not use herbal remedies, alcohol, illegal drugs, or medicines that are not approved by your health care provider. Chemicals in these products can harm your baby.  Do not use any products that contain nicotine or tobacco, such as cigarettes, e-cigarettes, and chewing tobacco. If you need help quitting, ask your health care provider. General instructions  During a routine prenatal visit, your health care provider will do a physical exam and other tests. He or she will also discuss your overall health. Keep all follow-up visits. This is important.  Ask your health care provider for a referral to a local prenatal education class.  Ask for help if you have counseling or nutritional needs during pregnancy. Your health care provider can offer advice or refer you to specialists for help with various needs. Where to find more information  American Pregnancy Association: americanpregnancy.org  American College of Obstetricians and Gynecologists: acog.org/en/Womens%20Health/Pregnancy  Office on Women's Health: womenshealth.gov/pregnancy Contact a health care provider if you have:  A headache that does not go away when you take medicine.  Vision changes or you see spots in front of your eyes.  Mild pelvic cramps, pelvic pressure, or nagging pain in the abdominal area.  Persistent nausea,  vomiting, or diarrhea.  A bad-smelling vaginal discharge or foul-smelling urine.  Pain when you urinate.  Sudden or extreme swelling of your face, hands, ankles, feet, or legs.  A fever. Get help right away if you:  Have fluid leaking from your vagina.  Have spotting or bleeding from your vagina.  Have severe abdominal cramping or pain.  Have difficulty breathing.  Have chest pain.  Have fainting spells.  Have not felt your baby move for the time period told by your health care provider.  Have new or increased pain, swelling, or redness in an arm or leg. Summary  The second trimester of pregnancy is from week 13 through week 27 (months 4 through 6).  Do not use herbal remedies, alcohol, illegal drugs, or medicines that are not approved by your health care provider. Chemicals in these products can harm your baby.  Exercise only as directed by your health care provider. Most people can continue their usual exercise routine during pregnancy.  Keep all follow-up visits. This is important. This information is not intended to replace advice given to you by   your health care provider. Make sure you discuss any questions you have with your health care provider. Document Revised: 10/31/2019 Document Reviewed: 09/06/2019 Elsevier Patient Education  2021 [employer]   Contraception Choices Contraception, also called birth control, refers to methods or devices that prevent pregnancy. Hormonal methods Contraceptive implant A contraceptive implant is a thin, plastic tube that contains a hormone that prevents pregnancy. It is different from an intrauterine device (IUD). It is inserted into the upper part of the arm by a health care provider. Implants can be effective for up to 3 years. Progestin-only injections Progestin-only injections are injections of progestin, a synthetic form of the hormone progesterone. They are given every 3 months by a health care provider. Birth control  pills Birth control pills are pills that contain hormones that prevent pregnancy. They must be taken once a day, preferably at the same time each day. A prescription is needed to use this method of contraception. Birth control patch The birth control patch contains hormones that prevent pregnancy. It is placed on the skin and must be changed once a week for three weeks and removed on the fourth week. A prescription is needed to use this method of contraception. Vaginal ring A vaginal ring contains hormones that prevent pregnancy. It is placed in the vagina for three weeks and removed on the fourth week. After that, the process is repeated with a new ring. A prescription is needed to use this method of contraception. Emergency contraceptive Emergency contraceptives prevent pregnancy after unprotected sex. They come in pill form and can be taken up to 5 days after sex. They work best the sooner they are taken after having sex. Most emergency contraceptives are available without a prescription. This method should not be used as your only form of birth control.   Barrier methods Female condom A female condom is a thin sheath that is worn over the penis during sex. Condoms keep sperm from going inside a woman's body. They can be used with a sperm-killing substance (spermicide) to increase their effectiveness. They should be thrown away after one use. Female condom A female condom is a soft, loose-fitting sheath that is put into the vagina before sex. The condom keeps sperm from going inside a woman's body. They should be thrown away after one use. Diaphragm A diaphragm is a soft, dome-shaped barrier. It is inserted into the vagina before sex, along with a spermicide. The diaphragm blocks sperm from entering the uterus, and the spermicide kills sperm. A diaphragm should be left in the vagina for 6-8 hours after sex and removed within 24 hours. A diaphragm is prescribed and fitted by a health care provider. A  diaphragm should be replaced every 1-2 years, after giving birth, after gaining more than 15 lb (6.8 kg), and after pelvic surgery. Cervical cap A cervical cap is a round, soft latex or plastic cup that fits over the cervix. It is inserted into the vagina before sex, along with spermicide. It blocks sperm from entering the uterus. The cap should be left in place for 6-8 hours after sex and removed within 48 hours. A cervical cap must be prescribed and fitted by a health care provider. It should be replaced every 2 years. Sponge A sponge is a soft, circular piece of polyurethane foam with spermicide in it. The sponge helps block sperm from entering the uterus, and the spermicide kills sperm. To use it, you make it wet and then insert it into the vagina. It should be   inserted before sex, left in for at least 6 hours after sex, and removed and thrown away within 30 hours. Spermicides Spermicides are chemicals that kill or block sperm from entering the cervix and uterus. They can come as a cream, jelly, suppository, foam, or tablet. A spermicide should be inserted into the vagina with an applicator at least 10-15 minutes before sex to allow time for it to work. The process must be repeated every time you have sex. Spermicides do not require a prescription.   Intrauterine contraception Intrauterine device (IUD) An IUD is a T-shaped device that is put in a woman's uterus. There are two types:  Hormone IUD.This type contains progestin, a synthetic form of the hormone progesterone. This type can stay in place for 3-5 years.  Copper IUD.This type is wrapped in copper wire. It can stay in place for 10 years. Permanent methods of contraception Female tubal ligation In this method, a woman's fallopian tubes are sealed, tied, or blocked during surgery to prevent eggs from traveling to the uterus. Hysteroscopic sterilization In this method, a small, flexible insert is placed into each fallopian tube. The inserts  cause scar tissue to form in the fallopian tubes and block them, so sperm cannot reach an egg. The procedure takes about 3 months to be effective. Another form of birth control must be used during those 3 months. Female sterilization This is a procedure to tie off the tubes that carry sperm (vasectomy). After the procedure, the man can still ejaculate fluid (semen). Another form of birth control must be used for 3 months after the procedure. Natural planning methods Natural family planning In this method, a couple does not have sex on days when the woman could become pregnant. Calendar method In this method, the woman keeps track of the length of each menstrual cycle, identifies the days when pregnancy can happen, and does not have sex on those days. Ovulation method In this method, a couple avoids sex during ovulation. Symptothermal method This method involves not having sex during ovulation. The woman typically checks for ovulation by watching changes in her temperature and in the consistency of cervical mucus. Post-ovulation method In this method, a couple waits to have sex until after ovulation. Where to find more information  Centers for Disease Control and Prevention: www.cdc.gov Summary  Contraception, also called birth control, refers to methods or devices that prevent pregnancy.  Hormonal methods of contraception include implants, injections, pills, patches, vaginal rings, and emergency contraceptives.  Barrier methods of contraception can include female condoms, female condoms, diaphragms, cervical caps, sponges, and spermicides.  There are two types of IUDs (intrauterine devices). An IUD can be put in a woman's uterus to prevent pregnancy for 3-5 years.  Permanent sterilization can be done through a procedure for males and females. Natural family planning methods involve nothaving sex on days when the woman could become pregnant. This information is not intended to replace advice  given to you by your health care provider. Make sure you discuss any questions you have with your health care provider. Document Revised: 10/29/2019 Document Reviewed: 10/29/2019 Elsevier Patient Education  2021 [employer] Inc.   Breastfeeding  Choosing to breastfeed is one of the best decisions you can make for yourself and your baby. A change in hormones during pregnancy causes your breasts to make breast milk in your milk-producing glands. Hormones prevent breast milk from being released before your baby is born. They also prompt milk flow after birth. Once breastfeeding has begun, thoughts of   your baby, as well as his or her sucking or crying, can stimulate the release of milk from your milk-producing glands. Benefits of breastfeeding Research shows that breastfeeding offers many health benefits for infants and mothers. It also offers a cost-free and convenient way to feed your baby. For your baby  Your first milk (colostrum) helps your baby's digestive system to function better.  Special cells in your milk (antibodies) help your baby to fight off infections.  Breastfed babies are less likely to develop asthma, allergies, obesity, or type 2 diabetes. They are also at lower risk for sudden infant death syndrome (SIDS).  Nutrients in breast milk are better able to meet your baby's needs compared to infant formula.  Breast milk improves your baby's brain development. For you  Breastfeeding helps to create a very special bond between you and your baby.  Breastfeeding is convenient. Breast milk costs nothing and is always available at the correct temperature.  Breastfeeding helps to burn calories. It helps you to lose the weight that you gained during pregnancy.  Breastfeeding makes your uterus return faster to its size before pregnancy. It also slows bleeding (lochia) after you give birth.  Breastfeeding helps to lower your risk of developing type 2 diabetes, osteoporosis, rheumatoid  arthritis, cardiovascular disease, and breast, ovarian, uterine, and endometrial cancer later in life. Breastfeeding basics Starting breastfeeding  Find a comfortable place to sit or lie down, with your neck and back well-supported.  Place a pillow or a rolled-up blanket under your baby to bring him or her to the level of your breast (if you are seated). Nursing pillows are specially designed to help support your arms and your baby while you breastfeed.  Make sure that your baby's tummy (abdomen) is facing your abdomen.  Gently massage your breast. With your fingertips, massage from the outer edges of your breast inward toward the nipple. This encourages milk flow. If your milk flows slowly, you may need to continue this action during the feeding.  Support your breast with 4 fingers underneath and your thumb above your nipple (make the letter "C" with your hand). Make sure your fingers are well away from your nipple and your baby's mouth.  Stroke your baby's lips gently with your finger or nipple.  When your baby's mouth is open wide enough, quickly bring your baby to your breast, placing your entire nipple and as much of the areola as possible into your baby's mouth. The areola is the colored area around your nipple. ? More areola should be visible above your baby's upper lip than below the lower lip. ? Your baby's lips should be opened and extended outward (flanged) to ensure an adequate, comfortable latch. ? Your baby's tongue should be between his or her lower gum and your breast.  Make sure that your baby's mouth is correctly positioned around your nipple (latched). Your baby's lips should create a seal on your breast and be turned out (everted).  It is common for your baby to suck about 2-3 minutes in order to start the flow of breast milk. Latching Teaching your baby how to latch onto your breast properly is very important. An improper latch can cause nipple pain, decreased milk  supply, and poor weight gain in your baby. Also, if your baby is not latched onto your nipple properly, he or she may swallow some air during feeding. This can make your baby fussy. Burping your baby when you switch breasts during the feeding can help to get rid   of the air. However, teaching your baby to latch on properly is still the best way to prevent fussiness from swallowing air while breastfeeding. Signs that your baby has successfully latched onto your nipple  Silent tugging or silent sucking, without causing you pain. Infant's lips should be extended outward (flanged).  Swallowing heard between every 3-4 sucks once your milk has started to flow (after your let-down milk reflex occurs).  Muscle movement above and in front of his or her ears while sucking. Signs that your baby has not successfully latched onto your nipple  Sucking sounds or smacking sounds from your baby while breastfeeding.  Nipple pain. If you think your baby has not latched on correctly, slip your finger into the corner of your baby's mouth to break the suction and place it between your baby's gums. Attempt to start breastfeeding again. Signs of successful breastfeeding Signs from your baby  Your baby will gradually decrease the number of sucks or will completely stop sucking.  Your baby will fall asleep.  Your baby's body will relax.  Your baby will retain a small amount of milk in his or her mouth.  Your baby will let go of your breast by himself or herself. Signs from you  Breasts that have increased in firmness, weight, and size 1-3 hours after feeding.  Breasts that are softer immediately after breastfeeding.  Increased milk volume, as well as a change in milk consistency and color by the fifth day of breastfeeding.  Nipples that are not sore, cracked, or bleeding. Signs that your baby is getting enough milk  Wetting at least 1-2 diapers during the first 24 hours after birth.  Wetting at least 5-6  diapers every 24 hours for the first week after birth. The urine should be clear or pale yellow by the age of 5 days.  Wetting 6-8 diapers every 24 hours as your baby continues to grow and develop.  At least 3 stools in a 24-hour period by the age of 5 days. The stool should be soft and yellow.  At least 3 stools in a 24-hour period by the age of 7 days. The stool should be seedy and yellow.  No loss of weight greater than 10% of birth weight during the first 3 days of life.  Average weight gain of 4-7 oz (113-198 g) per week after the age of 4 days.  Consistent daily weight gain by the age of 5 days, without weight loss after the age of 2 weeks. After a feeding, your baby may spit up a small amount of milk. This is normal. Breastfeeding frequency and duration Frequent feeding will help you make more milk and can prevent sore nipples and extremely full breasts (breast engorgement). Breastfeed when you feel the need to reduce the fullness of your breasts or when your baby shows signs of hunger. This is called "breastfeeding on demand." Signs that your baby is hungry include:  Increased alertness, activity, or restlessness.  Movement of the head from side to side.  Opening of the mouth when the corner of the mouth or cheek is stroked (rooting).  Increased sucking sounds, smacking lips, cooing, sighing, or squeaking.  Hand-to-mouth movements and sucking on fingers or hands.  Fussing or crying. Avoid introducing a pacifier to your baby in the first 4-6 weeks after your baby is born. After this time, you may choose to use a pacifier. Research has shown that pacifier use during the first year of a baby's life decreases the risk of   sudden infant death syndrome (SIDS). Allow your baby to feed on each breast as long as he or she wants. When your baby unlatches or falls asleep while feeding from the first breast, offer the second breast. Because newborns are often sleepy in the first few weeks of  life, you may need to awaken your baby to get him or her to feed. Breastfeeding times will vary from baby to baby. However, the following rules can serve as a guide to help you make sure that your baby is properly fed:  Newborns (babies 4 weeks of age or younger) may breastfeed every 1-3 hours.  Newborns should not go without breastfeeding for longer than 3 hours during the day or 5 hours during the night.  You should breastfeed your baby a minimum of 8 times in a 24-hour period. Breast milk pumping Pumping and storing breast milk allows you to make sure that your baby is exclusively fed your breast milk, even at times when you are unable to breastfeed. This is especially important if you go back to work while you are still breastfeeding, or if you are not able to be present during feedings. Your lactation consultant can help you find a method of pumping that works best for you and give you guidelines about how long it is safe to store breast milk.      Caring for your breasts while you breastfeed Nipples can become dry, cracked, and sore while breastfeeding. The following recommendations can help keep your breasts moisturized and healthy:  Avoid using soap on your nipples.  Wear a supportive bra designed especially for nursing. Avoid wearing underwire-style bras or extremely tight bras (sports bras).  Air-dry your nipples for 3-4 minutes after each feeding.  Use only cotton bra pads to absorb leaked breast milk. Leaking of breast milk between feedings is normal.  Use lanolin on your nipples after breastfeeding. Lanolin helps to maintain your skin's normal moisture barrier. Pure lanolin is not harmful (not toxic) to your baby. You may also hand express a few drops of breast milk and gently massage that milk into your nipples and allow the milk to air-dry. In the first few weeks after giving birth, some women experience breast engorgement. Engorgement can make your breasts feel heavy, warm,  and tender to the touch. Engorgement peaks within 3-5 days after you give birth. The following recommendations can help to ease engorgement:  Completely empty your breasts while breastfeeding or pumping. You may want to start by applying warm, moist heat (in the shower or with warm, water-soaked hand towels) just before feeding or pumping. This increases circulation and helps the milk flow. If your baby does not completely empty your breasts while breastfeeding, pump any extra milk after he or she is finished.  Apply ice packs to your breasts immediately after breastfeeding or pumping, unless this is too uncomfortable for you. To do this: ? Put ice in a plastic bag. ? Place a towel between your skin and the bag. ? Leave the ice on for 20 minutes, 2-3 times a day.  Make sure that your baby is latched on and positioned properly while breastfeeding. If engorgement persists after 48 hours of following these recommendations, contact your health care provider or a lactation consultant. Overall health care recommendations while breastfeeding  Eat 3 healthy meals and 3 snacks every day. Well-nourished mothers who are breastfeeding need an additional 450-500 calories a day. You can meet this requirement by increasing the amount of a balanced diet that   you eat.  Drink enough water to keep your urine pale yellow or clear.  Rest often, relax, and continue to take your prenatal vitamins to prevent fatigue, stress, and low vitamin and mineral levels in your body (nutrient deficiencies).  Do not use any products that contain nicotine or tobacco, such as cigarettes and e-cigarettes. Your baby may be harmed by chemicals from cigarettes that pass into breast milk and exposure to secondhand smoke. If you need help quitting, ask your health care provider.  Avoid alcohol.  Do not use illegal drugs or marijuana.  Talk with your health care provider before taking any medicines. These include over-the-counter and  prescription medicines as well as vitamins and herbal supplements. Some medicines that may be harmful to your baby can pass through breast milk.  It is possible to become pregnant while breastfeeding. If birth control is desired, ask your health care provider about options that will be safe while breastfeeding your baby. Where to find more information: [employer]: www.llli.org Contact a health care provider if:  You feel like you want to stop breastfeeding or have become frustrated with breastfeeding.  Your nipples are cracked or bleeding.  Your breasts are red, tender, or warm.  You have: ? Painful breasts or nipples. ? A swollen area on either breast. ? A fever or chills. ? Nausea or vomiting. ? Drainage other than breast milk from your nipples.  Your breasts do not become full before feedings by the fifth day after you give birth.  You feel sad and depressed.  Your baby is: ? Too sleepy to eat well. ? Having trouble sleeping. ? More than 1 week old and wetting fewer than 6 diapers in a 24-hour period. ? Not gaining weight by 5 days of age.  Your baby has fewer than 3 stools in a 24-hour period.  Your baby's skin or the white parts of his or her eyes become yellow. Get help right away if:  Your baby is overly tired (lethargic) and does not want to wake up and feed.  Your baby develops an unexplained fever. Summary  Breastfeeding offers many health benefits for infant and mothers.  Try to breastfeed your infant when he or she shows early signs of hunger.  Gently tickle or stroke your baby's lips with your finger or nipple to allow the baby to open his or her mouth. Bring the baby to your breast. Make sure that much of the areola is in your baby's mouth. Offer one side and burp the baby before you offer the other side.  Talk with your health care provider or lactation consultant if you have questions or you face problems as you breastfeed. This  information is not intended to replace advice given to you by your health care provider. Make sure you discuss any questions you have with your health care provider. Document Revised: 08/18/2017 Document Reviewed: 06/25/2016 Elsevier Patient Education  2021 Elsevier Inc.  

## 2020-07-01 NOTE — Progress Notes (Signed)
Subjective:   Mary Vincent is a 20 y.o. G1P0000 at [redacted]w[redacted]d by early ultrasound being seen today for her first obstetrical visit.  Her obstetrical history is significant for n/a. Patient does not intend to breast feed. Pregnancy history fully reviewed.  Patient reports no complaints.  HISTORY: OB History  Gravida Para Term Preterm AB Living  1 0 0 0 0 0  SAB IAB Ectopic Multiple Live Births  0 0 0 0 0    # Outcome Date GA Lbr Len/2nd Weight Sex Delivery Anes PTL Lv  1 Current            Last pap smear was: none on file  Past Medical History:  Diagnosis Date  . Anemia   . Chlamydia   . Chlamydia contact, treated   . Ovarian cyst   . Trichomonas infection    Past Surgical History:  Procedure Laterality Date  . NO PAST SURGERIES    . WISDOM TOOTH EXTRACTION     Family History  Problem Relation Age of Onset  . Healthy Mother   . Healthy Father   . Hypertension Maternal Grandmother    Social History   Tobacco Use  . Smoking status: Former Smoker    Quit date: 10/06/2012    Years since quitting: 7.7  . Smokeless tobacco: Never Used  Vaping Use  . Vaping Use: Never used  Substance Use Topics  . Alcohol use: Yes    Comment: weekends  . Drug use: Yes    Frequency: 7.0 times per week    Types: Marijuana    Comment: last was 12/4   No Known Allergies Current Outpatient Medications on File Prior to Visit  Medication Sig Dispense Refill  . Prenatal Vit-Fe Fumarate-FA (PREPLUS) 27-1 MG TABS Take 1 tablet by mouth daily. 30 tablet 13  . [DISCONTINUED] cetirizine (ZYRTEC) 10 MG tablet Take 1 tablet (10 mg total) by mouth daily. 30 tablet 0   No current facility-administered medications on file prior to visit.     Exam   Vitals:   07/01/20 1422  BP: 100/60  Pulse: 74  Weight: 124 lb 12.8 oz (56.6 kg)   Fetal Heart Rate (bpm): 164  Uterus:     Pelvic Exam: Perineum: no hemorrhoids, normal perineum   Vulva: normal external genitalia, no lesions   Vagina:   normal mucosa, normal discharge   Cervix: no lesions and normal, pap smear done.   System: General: well-developed, well-nourished female in no acute distress   Skin: normal coloration and turgor, no rashes   Neurologic: oriented, normal, negative, normal mood   Extremities: normal strength, tone, and muscle mass, ROM of all joints is normal   HEENT PERRLA, extraocular movement intact and sclera clear, anicteric   Mouth/Teeth mucous membranes moist, pharynx normal without lesions and dental hygiene good   Neck supple and no masses   Cardiovascular: regular rate and rhythm   Respiratory:  no respiratory distress, normal breath sounds   Abdomen: soft, non-tender; bowel sounds normal; no masses,  no organomegaly     Assessment:   Pregnancy: G1P0000 Patient Active Problem List   Diagnosis Date Noted  . Oppositional defiant disorder 07/01/2020  . Supervision of low-risk first pregnancy, first trimester 07/01/2020     Plan:  1. Supervision of low-risk first pregnancy, first trimester Unplanned but welcome pregnancy Phenergan rx for control of nausea Initial labs drawn. Continue prenatal vitamins. Genetic Screening discussed, NIPS: ordered. Ultrasound discussed; fetal anatomic survey: ordered. Problem list  reviewed and updated. The nature of [facility] with multiple MDs and other Advanced Practice Providers was explained to patient; also emphasized that residents, students are part of our team. Routine obstetric precautions reviewed. - [place] MFM OB COMP + 14 WK; Future - [facility] AMB BABYSCRIPTS SCHEDULE OPTIMIZATION - Blood Pressure Monitoring (BLOOD PRESSURE KIT) DEVI; 1 Device by Does not apply route once for 1 dose.  Dispense: 1 each; Refill: 0    Return in 4 weeks (on 07/29/2020), or f/u OB visit, [facility].

## 2020-07-02 ENCOUNTER — Encounter: Payer: Self-pay | Admitting: *Deleted

## 2020-07-02 LAB — CBC/D/PLT+RPR+RH+ABO+RUB AB...
Antibody Screen: NEGATIVE
Basophils Absolute: 0 10*3/uL (ref 0.0–0.2)
Basos: 1 %
EOS (ABSOLUTE): 0.2 10*3/uL (ref 0.0–0.4)
Eos: 3 %
HCV Ab: <0.1 s/co ratio (ref 0.0–0.9)
HIV Screen 4th Generation wRfx: NONREACTIVE
Hematocrit: 36.5 % (ref 34.0–46.6)
Hemoglobin: 11.8 g/dL (ref 11.1–15.9)
Hepatitis B Surface Ag: NEGATIVE
Immature Grans (Abs): 0 10*3/uL (ref 0.0–0.1)
Immature Granulocytes: 0 %
Lymphocytes Absolute: 1 10*3/uL (ref 0.7–3.1)
Lymphs: 19 %
MCH: 25.2 pg — ABNORMAL LOW (ref 26.6–33.0)
MCHC: 32.3 g/dL (ref 31.5–35.7)
MCV: 78 fL — ABNORMAL LOW (ref 79–97)
Monocytes Absolute: 0.5 10*3/uL (ref 0.1–0.9)
Monocytes: 10 %
Neutrophils Absolute: 3.8 10*3/uL (ref 1.4–7.0)
Neutrophils: 67 %
Platelets: 233 10*3/uL (ref 150–450)
RBC: 4.68 x10E6/uL (ref 3.77–5.28)
RDW: 18.3 % — ABNORMAL HIGH (ref 11.7–15.4)
RPR Ser Ql: NONREACTIVE
Rh Factor: POSITIVE
Rubella Antibodies, IGG: <0.9 index — ABNORMAL LOW (ref >0.99)
WBC: 5.6 10*3/uL (ref 3.4–10.8)

## 2020-07-02 LAB — HCV INTERPRETATION

## 2020-07-03 ENCOUNTER — Encounter: Payer: Self-pay | Admitting: Family Medicine

## 2020-07-03 DIAGNOSIS — Z2839 Other underimmunization status: Secondary | ICD-10-CM | POA: Insufficient documentation

## 2020-07-03 DIAGNOSIS — O09899 Supervision of other high risk pregnancies, unspecified trimester: Secondary | ICD-10-CM | POA: Insufficient documentation

## 2020-07-03 DIAGNOSIS — Z3490 Encounter for supervision of normal pregnancy, unspecified, unspecified trimester: Secondary | ICD-10-CM | POA: Insufficient documentation

## 2020-07-03 DIAGNOSIS — Z283 Underimmunization status: Secondary | ICD-10-CM | POA: Insufficient documentation

## 2020-07-03 LAB — CYTOLOGY - PAP: Diagnosis: NEGATIVE

## 2020-07-03 LAB — URINE CULTURE, OB REFLEX

## 2020-07-03 LAB — CULTURE, OB URINE

## 2020-07-09 ENCOUNTER — Telehealth: Payer: Medicaid Other | Admitting: Nurse Practitioner

## 2020-07-09 DIAGNOSIS — K5900 Constipation, unspecified: Secondary | ICD-10-CM

## 2020-07-09 NOTE — Progress Notes (Signed)
Based on what you shared with me it looks like you have constipation,that should be evaluated in a face to face office visit. You will need to ask your OB what you can take for constipation, since you are pregnant. If  You do not have an OB yet you will need to make an appointment ASAP to address your constipation.    NOTE: If you entered your credit card information for this eVisit, you will not be charged. You may see a "hold" on your card for the $35 but that hold will drop off and you will not have a charge processed.  If you are having a true medical emergency please call 911.     For an urgent face to face visit, [facility] has four urgent care centers for your convenience:   . [facility]    [phone]                  Get Driving Directions  [address] [place], [place] [address] . 10 am to 8 pm Monday-Friday . 12 pm to 8 pm Saturday-Sunday   . [facility] at [facility]  [phone]                  Get Driving Directions  [address] [place], [place] [address] . 8 am to 8 pm Monday-Friday . 9 am to 6 pm Saturday . 11 am to 6 pm Sunday   . [facility] at [facility]  [phone]                  Get Driving Directions   [address] [place], [place] [address] . 8 am to 8 pm Monday-Friday . 8 am to 4 pm Saturday-Sunday    . [facility] at [facility] Directions  [phone]  [address], [address] [place], [place] [address]  . Monday-Friday, 12 PM to 6 PM    Your e-visit answers were reviewed by a board certified advanced clinical practitioner to complete your personal care plan.  Thank you for using e-Visits.

## 2020-07-14 ENCOUNTER — Telehealth: Payer: Medicaid Other | Admitting: Family

## 2020-07-14 DIAGNOSIS — Z3490 Encounter for supervision of normal pregnancy, unspecified, unspecified trimester: Secondary | ICD-10-CM

## 2020-07-14 DIAGNOSIS — N898 Other specified noninflammatory disorders of vagina: Secondary | ICD-10-CM

## 2020-07-14 NOTE — Progress Notes (Signed)
Based on what you shared with me, I feel your condition warrants further evaluation and I recommend that you be seen for a face to face office visit.  Given you are pregnant, it would be best to be seen face to face to have testing to see what is causing your discharge.    NOTE: If you entered your credit card information for this eVisit, you will not be charged. You may see a "hold" on your card for the $35 but that hold will drop off and you will not have a charge processed.   If you are having a true medical emergency please call 911.      For an urgent face to face visit, [facility] has five urgent care centers for your convenience:     [facility] at [facility] Directions [phone] [address] [place], [place] [address] . 10 am - 6pm Monday - Friday    [facility]) Get Driving Directions [phone] [address] [place], [place] [address] . 10 am to 8 pm Monday-Friday . 12 pm to 8 pm [facility] at [facility] Get Driving Directions [phone] [address] [place], [place] [address] . 8 am to 8 pm Monday-Friday . 9 am to 6 pm Saturday . 11 am to 6 pm Sunday     [facility] at [facility] Get Driving Directions  [phone] [address] [place], [place] [address] . 8 am to 8 pm Monday-Friday . 8 am to 4 pm [facility] Urgent Care at [facility] Directions [phone] [address] [place], [place] [address] . 12 pm to 6 pm Monday-Friday      Your e-visit answers were reviewed by a board certified advanced clinical practitioner to complete your personal care plan.  Thank you for using e-Visits.

## 2020-07-16 ENCOUNTER — Telehealth: Payer: Self-pay | Admitting: Family Medicine

## 2020-07-16 NOTE — Telephone Encounter (Signed)
Pt called and needed a self swab for vaginal discharge,, poss BV per pt. I advised pt that we had an appt tomorrow at 10:20am-and she stated that she can not wait until tomorrow and  wanted to come in today, I advised I would have a nurse call her back. She requested a call ASAP.today

## 2020-07-17 NOTE — Telephone Encounter (Signed)
Spoke with pt. Pt states being seen at [facility] urgent care  yesterday and was given vaginal itch cream Rx, but does not have name for it  for possible BV. Pt states having vaginal discharge with odor.  Pt states did not have swab at urgent care, only had urine sample. Pt denies any vaginal bleeding/spotting, cramps or pain at this time. Pt is 14+[redacted] weeks pregnant. Pt has next appt on 2/22. Pt aware. Pt advised to use Rx that was given and to send message or call with any further questions or concerns. Pt advised to keep appt on 2/22 at [facility]. Pt verbalized understanding and agreeable.  Encounter closed

## 2020-07-28 ENCOUNTER — Telehealth: Payer: Self-pay | Admitting: Lactation Services

## 2020-07-28 NOTE — Telephone Encounter (Signed)
Called patient with results of [employer] Screening Results. 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 Reviewed that it is recommended that she call [facility] @ [phone] to set up a Genetic Counseling Telephone Counseling Session to discuss. Reviewed it will be recommended that FOB also be tested and Avelina Laine will discuss with her on the phone. Patient voiced understanding.

## 2020-07-29 ENCOUNTER — Ambulatory Visit (INDEPENDENT_AMBULATORY_CARE_PROVIDER_SITE_OTHER): Payer: Medicaid Other | Admitting: Obstetrics and Gynecology

## 2020-07-29 ENCOUNTER — Encounter: Payer: Self-pay | Admitting: Obstetrics and Gynecology

## 2020-07-29 ENCOUNTER — Other Ambulatory Visit: Payer: Self-pay

## 2020-07-29 VITALS — BP 101/65 | HR 70 | Wt 129.6 lb

## 2020-07-29 DIAGNOSIS — D563 Thalassemia minor: Secondary | ICD-10-CM

## 2020-07-29 DIAGNOSIS — Z3A15 15 weeks gestation of pregnancy: Secondary | ICD-10-CM

## 2020-07-29 DIAGNOSIS — Z3402 Encounter for supervision of normal first pregnancy, second trimester: Secondary | ICD-10-CM

## 2020-07-29 MED ORDER — PRENATAL GUMMIES/DHA & FA 0.4-32.5 MG PO CHEW: 3.0000 | CHEWABLE_TABLET | Freq: Every day | ORAL | 12 refills | Status: DC

## 2020-07-29 NOTE — Patient Instructions (Signed)
Second Trimester of Pregnancy  The second trimester of pregnancy is from week 13 through week 27. This is months 4 through 6 of pregnancy. The second trimester is often a time when you feel your best. Your body has adjusted to being pregnant, and you begin to feel better physically. During the second trimester:  Morning sickness has lessened or stopped completely.  You may have more energy.  You may have an increase in appetite. The second trimester is also a time when the unborn baby (fetus) is growing rapidly. At the end of the sixth month, the fetus may be up to 12 inches long and weigh about 1 pounds. You will likely begin to feel the baby move (quickening) between 16 and 20 weeks of pregnancy. Body changes during your second trimester Your body continues to go through many changes during your second trimester. The changes vary and generally return to normal after the baby is born. Physical changes  Your weight will continue to increase. You will notice your lower abdomen bulging out.  You may begin to get stretch marks on your hips, abdomen, and breasts.  Your breasts will continue to grow and to become tender.  Dark spots or blotches (chloasma or mask of pregnancy) may develop on your face.  A dark line from your belly button to the pubic area (linea nigra) may appear.  You may have changes in your hair. These can include thickening of your hair, rapid growth, and changes in texture. Some people also have hair loss during or after pregnancy, or hair that feels dry or thin. Health changes  You may develop headaches.  You may have heartburn.  You may develop constipation.  You may develop hemorrhoids or swollen, bulging veins (varicose veins).  Your gums may bleed and may be sensitive to brushing and flossing.  You may urinate more often because the fetus is pressing on your bladder.  You may have back pain. This is caused by: ? Weight gain. ? Pregnancy hormones that  are relaxing the joints in your pelvis. ? A shift in weight and the muscles that support your balance. Follow these instructions at home: Medicines  Follow your health care provider's instructions regarding medicine use. Specific medicines may be either safe or unsafe to take during pregnancy. Do not take any medicines unless approved by your health care provider.  Take a prenatal vitamin that contains at least 600 micrograms (mcg) of folic acid. Eating and drinking  Eat a healthy diet that includes fresh fruits and vegetables, whole grains, good sources of protein such as meat, eggs, or tofu, and low-fat dairy products.  Avoid raw meat and unpasteurized juice, milk, and cheese. These carry germs that can harm you and your baby.  You may need to take these actions to prevent or treat constipation: ? Drink enough fluid to keep your urine pale yellow. ? Eat foods that are high in fiber, such as beans, whole grains, and fresh fruits and vegetables. ? Limit foods that are high in fat and processed sugars, such as fried or sweet foods. Activity  Exercise only as directed by your health care provider. Most people can continue their usual exercise routine during pregnancy. Try to exercise for 30 minutes at least 5 days a week. Stop exercising if you develop contractions in your uterus.  Stop exercising if you develop pain or cramping in the lower abdomen or lower back.  Avoid exercising if it is very hot or humid or if you are at   a high altitude.  Avoid heavy lifting.  If you choose to, you may have sex unless your health care provider tells you not to. Relieving pain and discomfort  Wear a supportive bra to prevent discomfort from breast tenderness.  Take warm sitz baths to soothe any pain or discomfort caused by hemorrhoids. Use hemorrhoid cream if your health care provider approves.  Rest with your legs raised (elevated) if you have leg cramps or low back pain.  If you develop  varicose veins: ? Wear support hose as told by your health care provider. ? Elevate your feet for 15 minutes, 3-4 times a day. ? Limit salt in your diet. Safety  Wear your seat belt at all times when driving or riding in a car.  Talk with your health care provider if someone is verbally or physically abusive to you. Lifestyle  Do not use hot tubs, steam rooms, or saunas.  Do not douche. Do not use tampons or scented sanitary pads.  Avoid cat litter boxes and soil used by cats. These carry germs that can cause birth defects in the baby and possibly loss of the fetus by miscarriage or stillbirth.  Do not use herbal remedies, alcohol, illegal drugs, or medicines that are not approved by your health care provider. Chemicals in these products can harm your baby.  Do not use any products that contain nicotine or tobacco, such as cigarettes, e-cigarettes, and chewing tobacco. If you need help quitting, ask your health care provider. General instructions  During a routine prenatal visit, your health care provider will do a physical exam and other tests. He or she will also discuss your overall health. Keep all follow-up visits. This is important.  Ask your health care provider for a referral to a local prenatal education class.  Ask for help if you have counseling or nutritional needs during pregnancy. Your health care provider can offer advice or refer you to specialists for help with various needs. Where to find more information  American Pregnancy Association: americanpregnancy.org  American College of Obstetricians and Gynecologists: acog.org/en/Womens%20Health/Pregnancy  Office on Women's Health: womenshealth.gov/pregnancy Contact a health care provider if you have:  A headache that does not go away when you take medicine.  Vision changes or you see spots in front of your eyes.  Mild pelvic cramps, pelvic pressure, or nagging pain in the abdominal area.  Persistent nausea,  vomiting, or diarrhea.  A bad-smelling vaginal discharge or foul-smelling urine.  Pain when you urinate.  Sudden or extreme swelling of your face, hands, ankles, feet, or legs.  A fever. Get help right away if you:  Have fluid leaking from your vagina.  Have spotting or bleeding from your vagina.  Have severe abdominal cramping or pain.  Have difficulty breathing.  Have chest pain.  Have fainting spells.  Have not felt your baby move for the time period told by your health care provider.  Have new or increased pain, swelling, or redness in an arm or leg. Summary  The second trimester of pregnancy is from week 13 through week 27 (months 4 through 6).  Do not use herbal remedies, alcohol, illegal drugs, or medicines that are not approved by your health care provider. Chemicals in these products can harm your baby.  Exercise only as directed by your health care provider. Most people can continue their usual exercise routine during pregnancy.  Keep all follow-up visits. This is important. This information is not intended to replace advice given to you by your   health care provider. Make sure you discuss any questions you have with your health care provider. Document Revised: 10/31/2019 Document Reviewed: 09/06/2019 Elsevier Patient Education  2021 Elsevier Inc.   Thalassemia  Thalassemia is a blood disorder that causes a low level of red blood cells (anemia). This condition is passed from parent to child through abnormal genes (gene mutations). The mutations make it hard for your body to make the protein in red blood cells (hemoglobin) that carries oxygen from your lungs to the rest of your body. Red blood cells do not live long without hemoglobin. Loss of red blood cells leads to anemia, which is the main symptom of thalassemia. There are two main types of thalassemia. The type depends on which part of the hemoglobin is affected.  Alpha thalassemia affects the alpha part of  the hemoglobin. This is caused by four genes. You could get two from each parent.  Beta thalassemia affects the beta part of the hemoglobin. This is caused by two genes. You could get one from each parent. Thalassemia can be mild or severe. It depends on how many gene mutations you are born with. The more gene mutations you get, the more severe the condition. A person who inherits just one gene will be a carrier of the condition (thalassemia trait). A person with thalassemia trait may not have any symptoms or may have only mild anemia. A person who inherits two or more genes can have thalassemia minor, thalassemia intermedia, or thalassemia major. Thalassemia is a lifelong condition. There is no cure, but treatment can control symptoms and manage the condition. What are the causes? Thalassemia is cause by gene mutations that are passed down through families. What increases the risk? You are more likely to develop this condition if:  You have a family history of thalassemia.  Your ancestors are from [place], [place], [place], [place], [place], or the [place] What are the signs or symptoms? The most common signs and symptoms of thalassemia are the signs and symptoms of anemia. They include:  Weakness.  Tiredness.  Pounding heartbeat.  Dizziness.  Headache.  Leg cramps.  Pale skin.  Confusion.  Shortness of breath. Other signs and symptoms can also occur. You may have:  Yellow eyes or skin, and dark urine (jaundice). The breakdown of red blood cells can cause a yellowing pigment (bilirubin) to build up in your blood.  Weak bones (osteoporosis) and bone fractures. This is because bones can weaken from the effort of making more hemoglobin.  An enlarged spleen. This can lead to a swollen belly. Your spleen can become enlarged from filtering dead red blood cells.  Frequent, severe infections. This occurs if your spleen and bone marrow become weak. These organs make white  blood cells that your body needs to fight infections. How is this diagnosed? Your health care provider may suspect thalassemia based on your signs and symptoms, especially if you have a family history of the condition. This condition may be diagnosed:  In childhood, if you have severe forms of thalassemia. This is because symptoms show early in life.  At birth. In the [place] babies are screened for this condition.  In adulthood, if you have thalassemia trait or thalassemia minor. This happens if symptoms of anemia start or if a routine blood test shows unexplained anemia. Blood tests can confirm a thalassemia diagnosis. Blood tests may show:  Low hemoglobin.  Low iron.  Abnormal hemoglobin.  Thalassemia gene mutations. You may need to see a health care provider  who specializes in blood diseases (hematologist). How is this treated? Treatment for this condition depends on the type of thalassemia that you have:  If you have thalassemia trait or thalassemia minor, you may not need treatment. However, you may need treatment if you have thalassemia minor and you develop symptoms during an infection.  If you have thalassemia intermedia, you will have symptoms that require treatment.  If you have major thalassemia, you will have serious symptoms that require regular treatment. Thalassemia treatment may include:  Donated blood (transfusions) to replace red blood cells.  Vitamin B (folic acid) supplements to help produce hemoglobin and red blood cells.  Medicines or injections to remove iron buildup (chelation). This can happen in people who have frequent transfusions. Iron overload can damage heart, liver, and brain cells.  In the case of severe thalassemia: ? The spleen may need to be removed if it becomes damaged. ? Stem cell or bone marrow transplants may be necessary to transplant cells that can make red blood cells. This may be done if transfusions are not working. Follow these  instructions at home: Eating and drinking  Follow instructions from your health care provider about eating or drinking restrictions. You may need to avoid foods or drinks that are high in iron or fortified with iron.  Eat foods that are high in fiber, such as fresh fruits and vegetables, whole grains, and beans. Limit foods that are high in fat and processed sugars, such as fried and sweet foods. Nutrition is important for preventing anemia.   Activity  Return to your normal activities as told by your health care provider. Ask your health care provider what activities are safe for you.  Exercise is important for maintaining energy and strong bones. Ask your health care provider what amount and type of exercise is safe for you. General instructions  Take over-the-counter and prescription medicines only as told by your health care provider.  Keep all routine vaccinations and flu shots up to date to reduce your risk of infection.  Wash your hands frequently.  Do your best to avoid sick people, and stay out of crowds during cold and flu seasons.  Meet with a Dentist if you are or may become pregnant. A genetic counselor can explain the risks of passing thalassemia to a child.  Keep all follow-up visits as told by your health care provider. This is important.   Contact a health care provider if:  You have signs or symptoms of anemia.  You have a fever or other signs of infection.  Your belly is swollen.  You have jaundice. Get help right away if:  You feel very weak or short of breath. Summary  Thalassemia is a blood disorder that causes anemia.  Thalassemia can range from mild to severe.  This condition is passed down through families.  There is no cure, but treatment can manage the symptoms and prevent anemia. This information is not intended to replace advice given to you by your health care provider. Make sure you discuss any questions you have with your health  care provider. Document Revised: 09/20/2017 Document Reviewed: 09/20/2017 Elsevier Patient Education  2021 [employer].

## 2020-07-29 NOTE — Progress Notes (Addendum)
   LOW-RISK PREGNANCY OFFICE VISIT Patient name: Mary Vincent MRN [id number]  Date of birth: [birth year] Chief Complaint:   Routine Prenatal Visit  History of Present Illness:   Mary Vincent is a 20 y.o. G44P0000 female at [redacted]w[redacted]d with an Estimated Date of Delivery: 01/14/21 being seen today for ongoing management of a low-risk pregnancy.  Today she reports no complaints. Contractions: Not present. Vag. Bleeding: None.  Movement: Absent. denies leaking of fluid. Review of Systems:   Pertinent items are noted in HPI Denies abnormal vaginal discharge w/ itching/odor/irritation, headaches, visual changes, shortness of breath, chest pain, abdominal pain, severe nausea/vomiting, or problems with urination or bowel movements unless otherwise stated above. Pertinent History Reviewed:  Reviewed past medical,surgical, social, obstetrical and family history.  Reviewed problem list, medications and allergies. Physical Assessment:   Vitals:   07/29/20 1314  BP: 101/65  Pulse: 70  Weight: 129 lb 9.6 oz (58.8 kg)  Body mass index is 21.57 kg/m.        Physical Examination:   General appearance: Well appearing, and in no distress  Mental status: Alert, oriented to person, place, and time  Skin: Warm & dry  Cardiovascular: Normal heart rate noted  Respiratory: Normal respiratory effort, no distress  Abdomen: Soft, gravid, nontender  Pelvic: Cervical exam deferred         Extremities: Edema: None  Fetal Status: Fetal Heart Rate (bpm): 159 Fundal Height: 16 cm Movement: Absent    No results found for this or any previous visit (from the past 24 hour(s)).  Assessment & Plan:  1) Low-risk pregnancy G1P0000 at [redacted]w[redacted]d with an Estimated Date of Delivery: 01/14/21   2) Supervision of low-risk first pregnancy, second trimester - Information provided on second trimester pregnancy - Anatomy U/S scheduled for 08/20/2020 - Anticipatory guidance for My Chart upcoming visits  - Advised to go pick up BP  cuff and scale from [facility] - Rx for Prenatal gummies sent   3) Alpha thalassemia silent carrier - Referral to genetic counselor - FOB offered [place] provided on thalassemia   4) [redacted] weeks gestation of pregnancy    Meds: No orders of the defined types were placed in this encounter.  Labs/procedures today: none  Plan:  Continue routine obstetrical care   Reviewed: Preterm labor symptoms and general obstetric precautions including but not limited to vaginal bleeding, contractions, leaking of fluid and fetal movement were reviewed in detail with the patient.  All questions were answered. Does not have home bp cuff. [facility] has BP cuff and scale there for patient to pick up. Check bp weekly, let us know if >140/90.   Follow-up: Return in about 4 weeks (around 08/26/2020) for Return OB - My Chart video.  No orders of the defined types were placed in this encounter.  Laury Deep MSN, CNM 07/29/2020 1:48 PM

## 2020-07-29 NOTE — Addendum Note (Signed)
Addended by: Kenard Gower on: 07/29/2020 02:19 PM   Modules accepted: Orders

## 2020-08-01 ENCOUNTER — Encounter: Payer: Self-pay | Admitting: *Deleted

## 2020-08-08 ENCOUNTER — Encounter: Payer: Self-pay | Admitting: *Deleted

## 2020-08-20 ENCOUNTER — Other Ambulatory Visit: Payer: Self-pay

## 2020-08-20 ENCOUNTER — Ambulatory Visit: Payer: Medicaid Other | Attending: Family Medicine

## 2020-08-20 DIAGNOSIS — Z3401 Encounter for supervision of normal first pregnancy, first trimester: Secondary | ICD-10-CM

## 2020-08-26 ENCOUNTER — Telehealth (INDEPENDENT_AMBULATORY_CARE_PROVIDER_SITE_OTHER): Payer: Medicaid Other | Admitting: Nurse Practitioner

## 2020-08-26 DIAGNOSIS — Z5329 Procedure and treatment not carried out because of patient's decision for other reasons: Secondary | ICD-10-CM

## 2020-08-26 DIAGNOSIS — Z91199 Patient's noncompliance with other medical treatment and regimen due to unspecified reason: Secondary | ICD-10-CM

## 2020-08-26 NOTE — Progress Notes (Signed)
2:35 Patient not in virtual room. I called the patient and heard a message" mailbox is full and cannot accept messages. " Theon Sobotka,RN 2:40 Patient not in virtual room. I called her home/ mobile number and again heard a message" mailbox is full and cannot accept messages." will leave message for registrar to reschedule. Offie Pickron,RN

## 2020-09-02 ENCOUNTER — Other Ambulatory Visit: Payer: Self-pay

## 2020-09-02 ENCOUNTER — Ambulatory Visit (INDEPENDENT_AMBULATORY_CARE_PROVIDER_SITE_OTHER): Payer: Medicaid Other | Admitting: Obstetrics and Gynecology

## 2020-09-02 VITALS — BP 103/65 | HR 66 | Wt 137.2 lb

## 2020-09-02 DIAGNOSIS — R350 Frequency of micturition: Secondary | ICD-10-CM

## 2020-09-02 DIAGNOSIS — Z3402 Encounter for supervision of normal first pregnancy, second trimester: Secondary | ICD-10-CM

## 2020-09-02 DIAGNOSIS — Z3A20 20 weeks gestation of pregnancy: Secondary | ICD-10-CM

## 2020-09-02 LAB — POCT URINALYSIS DIP (DEVICE)
Bilirubin Urine: NEGATIVE
Glucose, UA: NEGATIVE mg/dL
Hgb urine dipstick: NEGATIVE
Ketones, ur: NEGATIVE mg/dL
Leukocytes,Ua: NEGATIVE
Nitrite: NEGATIVE
Protein, ur: NEGATIVE mg/dL
Specific Gravity, Urine: >=1.03 (ref 1.005–1.030)
Urobilinogen, UA: 0.2 mg/dL (ref 0.0–1.0)
pH: 7 (ref 5.0–8.0)

## 2020-09-02 NOTE — Progress Notes (Signed)
Pt states when she has the urge to urinate it's really not a lot.

## 2020-09-02 NOTE — Patient Instructions (Signed)

## 2020-09-02 NOTE — Progress Notes (Signed)
   PRENATAL VISIT NOTE  Subjective:  Mary Vincent is a 20 y.o. G1P0000 at [redacted]w[redacted]d being seen today for ongoing prenatal care.  She is currently monitored for the following issues for this low-risk pregnancy and has Oppositional defiant disorder; Supervision of low-risk first pregnancy, first trimester; and Rubella non-immune status, antepartum on their problem list.  Patient reports urinary frequency. No dysuria. No foul smelling urine or discharge. Contractions: Not present. Vag. Bleeding: None.  Movement: Present. Denies leaking of fluid.   The following portions of the patient's history were reviewed and updated as appropriate: allergies, current medications, past family history, past medical history, past social history, past surgical history and problem list.   Objective:   Vitals:   09/02/20 1333  BP: 103/65  Pulse: 66  Weight: 137 lb 3.2 oz (62.2 kg)    Fetal Status: Fetal Heart Rate (bpm): 145   Movement: Present     General:  Alert, oriented and cooperative. Patient is in no acute distress.  Skin: Skin is warm and dry. No rash noted.   Cardiovascular: Normal heart rate noted  Respiratory: Normal respiratory effort, no problems with respiration noted  Abdomen: Soft, gravid, appropriate for gestational age.  Pain/Pressure: Absent     Pelvic: Cervical exam deferred        Extremities: Normal range of motion.  Edema: None  Mental Status: Normal mood and affect. Normal behavior. Normal judgment and thought content.   Assessment and Plan:  Pregnancy: G1P0000 at [redacted]w[redacted]d 1. Supervision of low-risk first pregnancy, second trimester -UA obtained today  2. [redacted] weeks gestation of pregnancy -normal anatomy scan, doing well overall   Preterm labor symptoms and general obstetric precautions including but not limited to vaginal bleeding, contractions, leaking of fluid and fetal movement were reviewed in detail with the patient. Please refer to After Visit Summary for other counseling  recommendations.   Return in about 4 weeks (around 09/30/2020) for OB.  No future appointments.  Gita Kudo, MD

## 2020-09-03 LAB — URINALYSIS, ROUTINE W REFLEX MICROSCOPIC
Bilirubin, UA: NEGATIVE
Glucose, UA: NEGATIVE
Leukocytes,UA: NEGATIVE
Nitrite, UA: NEGATIVE
RBC, UA: NEGATIVE
Specific Gravity, UA: 1.025 (ref 1.005–1.030)
Urobilinogen, Ur: 1 mg/dL (ref 0.2–1.0)
pH, UA: 6.5 (ref 5.0–7.5)

## 2020-09-12 ENCOUNTER — Telehealth: Payer: Self-pay | Admitting: Family Medicine

## 2020-09-12 NOTE — Telephone Encounter (Signed)
Pt states she is 22 weeks preg and saw discharge today and needs to know if this is normal

## 2020-09-15 ENCOUNTER — Encounter: Payer: Self-pay | Admitting: *Deleted

## 2020-09-15 NOTE — Telephone Encounter (Signed)
Called pt regarding her concern for discharge on 4/8. Her voice mailbox was full and could not accept any messages. I sent a Mychart message to pt.

## 2020-09-24 ENCOUNTER — Telehealth: Payer: Self-pay | Admitting: Lactation Services

## 2020-09-24 NOTE — Telephone Encounter (Signed)
Called patient in regards to My Chart message about cramping she is reporting.   Patient reports she is having cramping in her abdomen that has been occurring for about 2 hours, she describes the pain as a 6/10. She feels like her abdomen is slightly tighter than normal. The cramps are at the bottom of her stomach and are not lessening in the last few hours.   She has not tried to take Tylenol for the pain.   Patient denies vaginal discharge or bleeding. She reports she is able to walk now as compared to when she sent her message and that the pain worsens when she stands straight up.   Patient reports she has been drinking well. She reports no frequency, urgency or burning with urination.   She had a bowel movement earlier today but not much volume. She does not feel like she needs to use the bathroom. She has been having a bowel movement daily to every other day. She does not feel like she has been constipated.   She has been feeling the baby move a lot.   Reviewed with Dr. Shawnie Pons. She would like patient to be seen in the office tomorrow and to go to [facility] if not improving or pain is worsening.  Advised patient to take 2 Tylenol and drink lots of water and if does not improve in a few hours or worsens at any time, or she has vaginal leaking or bleeding she is to go to the MAU for evaluation today. Patient voiced understanding.   Appointment made in office tomorrow morning at 9:35, patient is aware.

## 2020-09-25 ENCOUNTER — Other Ambulatory Visit: Payer: Self-pay

## 2020-09-25 ENCOUNTER — Ambulatory Visit (INDEPENDENT_AMBULATORY_CARE_PROVIDER_SITE_OTHER): Payer: Medicaid Other | Admitting: Family Medicine

## 2020-09-25 ENCOUNTER — Encounter: Payer: Self-pay | Admitting: Family Medicine

## 2020-09-25 VITALS — BP 103/69 | HR 89 | Wt 137.6 lb

## 2020-09-25 DIAGNOSIS — Z3400 Encounter for supervision of normal first pregnancy, unspecified trimester: Secondary | ICD-10-CM

## 2020-09-25 DIAGNOSIS — Z2839 Other underimmunization status: Secondary | ICD-10-CM

## 2020-09-25 DIAGNOSIS — O09899 Supervision of other high risk pregnancies, unspecified trimester: Secondary | ICD-10-CM

## 2020-09-25 MED ORDER — PRENATAL ADULT GUMMY/DHA/FA 0.4-25 MG PO CHEW
1.0000 | CHEWABLE_TABLET | Freq: Every day | ORAL | 11 refills | Status: DC
Start: 2020-09-25 — End: 2020-09-30

## 2020-09-25 NOTE — Patient Instructions (Signed)
Safe Medications in Pregnancy    Constipation: Colace Ducolax suppositories Fleet enema Glycerin suppositories Metamucil Milk of magnesia Miralax Senokot Smooth move tea  You have constipation which is hard stools that are difficult to pass. It is important to have regular bowel movements every 1-3 days that are soft and easy to pass. Hard stools increase your risk of hemorrhoids and are very uncomfortable.   To prevent constipation you can increase the amount of fiber in your diet. Examples of foods with fiber are leafy greens, whole grain breads, oatmeal and other grains.  It is also important to drink at least eight 8oz glass of water everyday.   If you have not has a bowel movement in 4-5 days you made need to clean out your bowel.  This will have establish normal movement through your bowel.    Miralax Clean out  Take 8 capfuls of miralax in 64 oz of gatorade. You can use any fluid that appeals to you (gatorade, water, juice)  Continue to drink at least eight 8 oz glasses of water throughout the day  You can repeat with another 8 capfuls of miralax in 64 oz of gatorade if you are not having a large amount of stools  You will need to be at home and close to a bathroom for about 8 hours when you do the above as you may need to go to the bathroom frequently.   After you are cleaned out: - Start Colace100mg  twice daily - Start Miralax once daily - Start a daily fiber supplement like metamucil or citrucel - You can safely use enemas in pregnancy  - if you are having diarrhea you can reduce to Colace once a day or miralax every other day or a 1/2 capful daily.

## 2020-09-25 NOTE — Progress Notes (Signed)
Patient complains of pelvic cramps yesterday started 2p until 10 pm. Patient rates pain about a 6 but is not in pain today.

## 2020-09-25 NOTE — Progress Notes (Signed)
   PRENATAL VISIT NOTE  Subjective:  Mary Vincent is a 20 y.o. G1P0000 at 77w1dbeing seen today for ongoing prenatal care.  She is currently monitored for the following issues for this low-risk pregnancy and has Oppositional defiant disorder; Supervision of normal first pregnancy, antepartum; and Rubella non-immune status, antepartum on their problem list.  Patient reports no complaints.  Contractions: Not present. Vag. Bleeding: None.  Movement: Present. Denies leaking of fluid.   The following portions of the patient's history were reviewed and updated as appropriate: allergies, current medications, past family history, past medical history, past social history, past surgical history and problem list.   Objective:   Vitals:   09/25/20 0933  BP: 103/69  Pulse: 89  Weight: 137 lb 9.6 oz (62.4 kg)    Fetal Status: Fetal Heart Rate (bpm): 155 Fundal Height: 24 cm Movement: Present     General:  Alert, oriented and cooperative. Patient is in no acute distress.  Skin: Skin is warm and dry. No rash noted.   Cardiovascular: Normal heart rate noted  Respiratory: Normal respiratory effort, no problems with respiration noted  Abdomen: Soft, gravid, appropriate for gestational age.  Pain/Pressure: Absent     Pelvic: Cervical exam deferred        Extremities: Normal range of motion.  Edema: None  Mental Status: Normal mood and affect. Normal behavior. Normal judgment and thought content.   Assessment and Plan:  Pregnancy: G1P0000 at 259w1d 1. Rubella non-immune status, antepartum Needs MMR pp  2. Supervision of normal first pregnancy, antepartum Reviewed constipation and abdominal pain Provided resources in AVS about constipation management.  Sent in gummy vitamin rx Instructed to cancle 4/26 appt and schedule for 28 wk visit. Reviewed appt events at 28wks and need to arrive fasting.   - Prenatal MV & Min w/FA-DHA (PRENATAL ADULT GUMMY/DHA/FA) 0.4-25 MG CHEW; Chew 1 tablet by  mouth daily.  Dispense: 30 tablet; Refill: 11   Preterm labor symptoms and general obstetric precautions including but not limited to vaginal bleeding, contractions, leaking of fluid and fetal movement were reviewed in detail with the patient. Please refer to After Visit Summary for other counseling recommendations.   Return in about 4 weeks (around 10/23/2020) for Routine prenatal care, 28 wk labs.  Future Appointments  Date Time Provider Department Center  09/30/2020  2:15 [facility]  KiCaren MacadamMD

## 2020-09-29 ENCOUNTER — Telehealth: Payer: Self-pay | Admitting: Family Medicine

## 2020-09-29 NOTE — Telephone Encounter (Signed)
Patient would like for her vitamin's to be changed to [employer]

## 2020-09-30 ENCOUNTER — Encounter: Payer: Medicaid Other | Admitting: Advanced Practice Midwife

## 2020-09-30 MED ORDER — VITAFOL GUMMIES 3.33-0.333-34.8 MG PO CHEW: 1.0000 | CHEWABLE_TABLET | Freq: Every day | ORAL | 3 refills | Status: DC

## 2020-09-30 NOTE — Telephone Encounter (Signed)
Called patient in regards for request for Vitafol Vitamins. She reports that she wants to one she can get through insurance.

## 2020-10-23 ENCOUNTER — Other Ambulatory Visit: Payer: Self-pay

## 2020-10-23 ENCOUNTER — Ambulatory Visit (INDEPENDENT_AMBULATORY_CARE_PROVIDER_SITE_OTHER): Payer: Medicaid Other | Admitting: Student

## 2020-10-23 VITALS — BP 116/78 | HR 97 | Wt 142.5 lb

## 2020-10-23 DIAGNOSIS — Z23 Encounter for immunization: Secondary | ICD-10-CM | POA: Diagnosis not present

## 2020-10-23 DIAGNOSIS — Z3A28 28 weeks gestation of pregnancy: Secondary | ICD-10-CM | POA: Diagnosis not present

## 2020-10-23 DIAGNOSIS — Z3400 Encounter for supervision of normal first pregnancy, unspecified trimester: Secondary | ICD-10-CM

## 2020-10-23 NOTE — Progress Notes (Signed)
   PRENATAL VISIT NOTE  Subjective:  Mary Vincent is a 20 y.o. G1P0000 at [redacted]w[redacted]d being seen today for ongoing prenatal care.  She is currently monitored for the following issues for this low-risk pregnancy and has Oppositional defiant disorder; Supervision of normal first pregnancy, antepartum; and Rubella non-immune status, antepartum on their problem list.  Patient reports no complaints.  Contractions: Not present. Vag. Bleeding: None.  Movement: Present. Denies leaking of fluid.   The following portions of the patient's history were reviewed and updated as appropriate: allergies, current medications, past family history, past medical history, past social history, past surgical history and problem list.   Objective:   Vitals:   10/23/20 1015  BP: 116/78  Pulse: 97  Weight: 142 lb 8 oz (64.6 kg)    Fetal Status: Fetal Heart Rate (bpm): 166 Fundal Height: 26 cm Movement: Present     General:  Alert, oriented and cooperative. Patient is in no acute distress.  Skin: Skin is warm and dry. No rash noted.   Cardiovascular: Normal heart rate noted  Respiratory: Normal respiratory effort, no problems with respiration noted  Abdomen: Soft, gravid, appropriate for gestational age.  Pain/Pressure: Absent     Pelvic: Cervical exam deferred        Extremities: Normal range of motion.  Edema: None  Mental Status: Normal mood and affect. Normal behavior. Normal judgment and thought content.   Assessment and Plan:  Pregnancy: G1P0000 at [redacted]w[redacted]d 1. Supervision of normal first pregnancy, antepartum -does not want birth control -she will bring name of peds (will take to same place her sister takes children)  2. [redacted] weeks gestation of pregnancy -one hour GTT tomorrow; patient declined GTT today - patient understands that if she fails 1 hour, she will need 3 hour -orders placed for future lab work tomorrow  Preterm labor symptoms and general obstetric precautions including but not limited to  vaginal bleeding, contractions, leaking of fluid and fetal movement were reviewed in detail with the patient. Please refer to After Visit Summary for other counseling recommendations.   Return in about 1 day (around 10/24/2020), or for 1 hour GTT tomorrow and return in 2 weeks for LROB.  No future appointments.  Marylene Land, CNM

## 2020-10-23 NOTE — Patient Instructions (Signed)
Oral Glucose Tolerance Test During Pregnancy Why am I having this test? The oral glucose tolerance test (OGTT) is done to check how your body processes blood sugar (glucose). This is one of several tests used to diagnose diabetes that develops during pregnancy (gestational diabetes mellitus). Gestational diabetes is a short-term form of diabetes that some women develop while they are pregnant. It usually occurs during the second trimester of pregnancy and goes away after delivery. Testing, or screening, for gestational diabetes usually occurs at weeks 24-28 of pregnancy. You may have the OGTT test after having a 1-hour glucose screening test if the results from that test indicate that you may have gestational diabetes. This test may also be needed if:  You have a history of gestational diabetes.  There is a history of giving birth to very large babies or of losing pregnancies (having stillbirths).  You have signs and symptoms of diabetes, such as: ? Changes in your eyesight. ? Tingling or numbness in your hands or feet. ? Changes in hunger, thirst, and urination, and these are not explained by your pregnancy. What is being tested? This test measures the amount of glucose in your blood at different times during a period of 3 hours. This shows how well your body can process glucose. What kind of sample is taken? Blood samples are required for this test. They are usually collected by inserting a needle into a blood vessel.   How do I prepare for this test?  For 3 days before your test, eat normally. Have plenty of carbohydrate-rich foods.  Follow instructions from your health care provider about: ? Eating or drinking restrictions on the day of the test. You may be asked not to eat or drink anything other than water (to fast) starting 8-10 hours before the test. ? Changing or stopping your regular medicines. Some medicines may interfere with this test. Tell a health care provider about:  All  medicines you are taking, including vitamins, herbs, eye drops, creams, and over-the-counter medicines.  Any blood disorders you have.  Any surgeries you have had.  Any medical conditions you have. What happens during the test? First, your blood glucose will be measured. This is referred to as your fasting blood glucose because you fasted before the test. Then, you will drink a glucose solution that contains a certain amount of glucose. Your blood glucose will be measured again 1, 2, and 3 hours after you drink the solution. This test takes about 3 hours to complete. You will need to stay at the testing location during this time. During the testing period:  Do not eat or drink anything other than the glucose solution.  Do not exercise.  Do not use any products that contain nicotine or tobacco, such as cigarettes, e-cigarettes, and chewing tobacco. These can affect your test results. If you need help quitting, ask your health care provider. The testing procedure may vary among health care providers and hospitals. How are the results reported? Your results will be reported as milligrams of glucose per deciliter of blood (mg/dL) or millimoles per liter (mmol/L). There is more than one source for screening and diagnosis reference values used to diagnose gestational diabetes. Your health care provider will compare your results to normal values that were established after testing a large group of people (reference values). Reference values may vary among labs and hospitals. For this test (Carpenter-Coustan), reference values are:  Fasting: 95 mg/dL (5.3 mmol/L).  1 hour: 180 mg/dL (10.0 mmol/L).  2 hour:   155 mg/dL (8.6 mmol/L).  3 hour: 140 mg/dL (7.8 mmol/L). What do the results mean? Results below the reference values are considered normal. If two or more of your blood glucose levels are at or above the reference values, you may be diagnosed with gestational diabetes. If only one level is  high, your health care provider may suggest repeat testing or other tests to confirm a diagnosis. Talk with your health care provider about what your results mean. Questions to ask your health care provider Ask your health care provider, or the department that is doing the test:  When will my results be ready?  How will I get my results?  What are my treatment options?  What other tests do I need?  What are my next steps? Summary  The oral glucose tolerance test (OGTT) is one of several tests used to diagnose diabetes that develops during pregnancy (gestational diabetes mellitus). Gestational diabetes is a short-term form of diabetes that some women develop while they are pregnant.  You may have the OGTT test after having a 1-hour glucose screening test if the results from that test show that you may have gestational diabetes. You may also have this test if you have any symptoms or risk factors for this type of diabetes.  Talk with your health care provider about what your results mean. This information is not intended to replace advice given to you by your health care provider. Make sure you discuss any questions you have with your health care provider. Document Revised: 11/01/2019 Document Reviewed: 11/01/2019 Elsevier Patient Education  2021 Elsevier Inc.  

## 2020-10-24 ENCOUNTER — Other Ambulatory Visit: Payer: Medicaid Other

## 2020-10-24 DIAGNOSIS — Z3A28 28 weeks gestation of pregnancy: Secondary | ICD-10-CM

## 2020-10-25 LAB — CBC
Hematocrit: 33.1 % — ABNORMAL LOW (ref 34.0–46.6)
Hemoglobin: 10.8 g/dL — ABNORMAL LOW (ref 11.1–15.9)
MCH: 26.9 pg (ref 26.6–33.0)
MCHC: 32.6 g/dL (ref 31.5–35.7)
MCV: 82 fL (ref 79–97)
Platelets: 147 10*3/uL — ABNORMAL LOW (ref 150–450)
RBC: 4.02 x10E6/uL (ref 3.77–5.28)
RDW: 12.7 % (ref 11.7–15.4)
WBC: 6.6 10*3/uL (ref 3.4–10.8)

## 2020-10-25 LAB — GLUCOSE TOLERANCE, 1 HOUR: Glucose, 1Hr PP: 117 mg/dL (ref 65–199)

## 2020-10-25 LAB — HIV ANTIBODY (ROUTINE TESTING W REFLEX): HIV Screen 4th Generation wRfx: NONREACTIVE

## 2020-10-25 LAB — RPR: RPR Ser Ql: NONREACTIVE

## 2020-11-03 ENCOUNTER — Other Ambulatory Visit: Payer: Self-pay | Admitting: Student

## 2020-11-03 MED ORDER — FERROUS GLUCONATE 324 (37.5 FE) MG PO TABS: 1.0000 | ORAL_TABLET | ORAL | 1 refills | Status: DC

## 2020-11-09 ENCOUNTER — Encounter (HOSPITAL_COMMUNITY): Payer: Self-pay | Admitting: Obstetrics and Gynecology

## 2020-11-09 ENCOUNTER — Inpatient Hospital Stay (HOSPITAL_BASED_OUTPATIENT_CLINIC_OR_DEPARTMENT_OTHER): Payer: Medicaid Other

## 2020-11-09 ENCOUNTER — Observation Stay (HOSPITAL_COMMUNITY)
Admission: AD | Admit: 2020-11-09 | Discharge: 2020-11-11 | Disposition: A | Discharge status: Home / Self Care | Payer: Medicaid Other | Attending: Obstetrics and Gynecology | Admitting: Obstetrics and Gynecology

## 2020-11-09 ENCOUNTER — Other Ambulatory Visit: Payer: Self-pay

## 2020-11-09 DIAGNOSIS — O4693 Antepartum hemorrhage, unspecified, third trimester: Secondary | ICD-10-CM

## 2020-11-09 DIAGNOSIS — Z363 Encounter for antenatal screening for malformations: Secondary | ICD-10-CM

## 2020-11-09 DIAGNOSIS — O4703 False labor before 37 completed weeks of gestation, third trimester: Secondary | ICD-10-CM | POA: Diagnosis not present

## 2020-11-09 DIAGNOSIS — O358XX0 Maternal care for other (suspected) fetal abnormality and damage, not applicable or unspecified: Secondary | ICD-10-CM

## 2020-11-09 DIAGNOSIS — O4700 False labor before 37 completed weeks of gestation, unspecified trimester: Secondary | ICD-10-CM

## 2020-11-09 DIAGNOSIS — Z3400 Encounter for supervision of normal first pregnancy, unspecified trimester: Secondary | ICD-10-CM

## 2020-11-09 DIAGNOSIS — Z20822 Contact with and (suspected) exposure to covid-19: Secondary | ICD-10-CM | POA: Insufficient documentation

## 2020-11-09 DIAGNOSIS — Z148 Genetic carrier of other disease: Secondary | ICD-10-CM | POA: Diagnosis not present

## 2020-11-09 DIAGNOSIS — Z3A30 30 weeks gestation of pregnancy: Secondary | ICD-10-CM | POA: Insufficient documentation

## 2020-11-09 LAB — WET PREP, GENITAL
Sperm: NONE SEEN
Trich, Wet Prep: NONE SEEN
Yeast Wet Prep HPF POC: NONE SEEN

## 2020-11-09 LAB — CBC
HCT: 32.3 % — ABNORMAL LOW (ref 36.0–46.0)
Hemoglobin: 10.7 g/dL — ABNORMAL LOW (ref 12.0–15.0)
MCH: 27.3 pg (ref 26.0–34.0)
MCHC: 33.1 g/dL (ref 30.0–36.0)
MCV: 82.4 fL (ref 80.0–100.0)
Platelets: 168 10*3/uL (ref 150–400)
RBC: 3.92 MIL/uL (ref 3.87–5.11)
RDW: 12.7 % (ref 11.5–15.5)
WBC: 11.3 10*3/uL — ABNORMAL HIGH (ref 4.0–10.5)
nRBC: 0 % (ref 0.0–0.2)

## 2020-11-09 LAB — TYPE AND SCREEN
ABO/RH(D): O POS
Antibody Screen: NEGATIVE

## 2020-11-09 LAB — RESP PANEL BY RT-PCR (FLU A&B, COVID) ARPGX2
Influenza A by PCR: NEGATIVE
Influenza B by PCR: NEGATIVE
SARS Coronavirus 2 by RT PCR: NEGATIVE

## 2020-11-09 MED ORDER — SODIUM CHLORIDE 0.9 % IV BOLUS
500.0000 mL | Freq: Once | INTRAVENOUS | Status: AC
  Administered 2020-11-09: 500 mL via INTRAVENOUS

## 2020-11-09 MED ORDER — CALCIUM CARBONATE ANTACID 500 MG PO CHEW
2.0000 | CHEWABLE_TABLET | ORAL | Status: DC | PRN
  Administered 2020-11-10: 400 mg via ORAL
  Filled 2020-11-09: qty 2

## 2020-11-09 MED ORDER — DOCUSATE SODIUM 100 MG PO CAPS
100.0000 mg | ORAL_CAPSULE | Freq: Every day | ORAL | Status: DC
  Administered 2020-11-10 – 2020-11-11 (×2): 100 mg via ORAL
  Filled 2020-11-09 (×2): qty 1

## 2020-11-09 MED ORDER — SODIUM CHLORIDE 0.9% FLUSH: 3.0000 mL | INTRAVENOUS | Status: DC | PRN

## 2020-11-09 MED ORDER — PRENATAL MULTIVITAMIN CH
1.0000 | ORAL_TABLET | Freq: Every day | ORAL | Status: DC
  Administered 2020-11-10 – 2020-11-11 (×2): 1 via ORAL
  Filled 2020-11-09 (×2): qty 1

## 2020-11-09 MED ORDER — SODIUM CHLORIDE 0.9% FLUSH
3.0000 mL | Freq: Two times a day (BID) | INTRAVENOUS | Status: DC
  Administered 2020-11-09 – 2020-11-11 (×3): 3 mL via INTRAVENOUS

## 2020-11-09 MED ORDER — ZOLPIDEM TARTRATE 5 MG PO TABS: 5.0000 mg | ORAL_TABLET | Freq: Every evening | ORAL | Status: DC | PRN

## 2020-11-09 MED ORDER — ACETAMINOPHEN 325 MG PO TABS: 650.0000 mg | ORAL_TABLET | ORAL | Status: DC | PRN

## 2020-11-09 MED ORDER — SODIUM CHLORIDE 0.9 % IV SOLN: 250.0000 mL | INTRAVENOUS | Status: DC | PRN

## 2020-11-09 NOTE — MAU Note (Signed)
Pt reports to mau with c/o bright red vag bleeding that started after having intercourse about an hour ago. Pt reports she bled through underwear but has not passed any clots.  Pt denies any pain at this time.  Denies LOF. +FM  FHR 140

## 2020-11-09 NOTE — H&P (Signed)
Mary Vincent is a 20 y.o. female presenting for bright, red vaginal bleeding after SI about 1 hour prior to her arrival to MAU. She reports the bleeding was heavy enough that she bled through her underwear. She denies passing any blood clots, pain or LOF. She reports (+) FM. She is patient at [facility]; next appt is 11/13/2020. Her pregnancy is complicated by silent carrier for alpha thalassemia and Rubella non-immune.  OB History    Gravida  1   Para  0   Term  0   Preterm  0   AB  0   Living  0     SAB  0   IAB  0   Ectopic  0   Multiple  0   Live Births             Past Medical History:  Diagnosis Date  . Anemia   . Chlamydia   . Chlamydia contact, treated   . Ovarian cyst   . Trichomonas infection    Past Surgical History:  Procedure Laterality Date  . NO PAST SURGERIES    . WISDOM TOOTH EXTRACTION     Family History: family history includes Healthy in her father and mother; Hypertension in her maternal grandmother. Social History:  reports that she quit smoking about 8 years ago. She has never used smokeless tobacco. She reports previous alcohol use. She reports previous drug use. Frequency: 7.00 times per week. Drug: Marijuana.     Maternal Diabetes: No Genetic Screening: Normal Maternal Ultrasounds/Referrals: Normal Fetal Ultrasounds or other Referrals:  None Maternal Substance Abuse:  No Significant Maternal Medications:  None Significant Maternal Lab Results:  Other:  Other Comments:  Rubella non-immune, silent alpha thalassemia  Review of Systems  Constitutional: Negative.   HENT: Negative.   Eyes: Negative.   Respiratory: Negative.   Cardiovascular: Negative.   Gastrointestinal: Negative.   Endocrine: Negative.   Genitourinary: Positive for vaginal bleeding.  Musculoskeletal: Negative.   Skin: Negative.   Allergic/Immunologic: Negative.   Neurological: Negative.   Hematological: Negative.   Psychiatric/Behavioral: Negative.     Maternal Medical History:  Reason for admission: Vaginal bleeding.   Contractions: Not feeling contractions, but "keeps feeling her tightening up"  Fetal activity: Perceived fetal activity is normal.    Prenatal complications: Bleeding.   Prenatal Complications - Diabetes: none.    Dilation: 1 Effacement (%): Thick Station: Ballotable Exam by:: Raelyn Mora CNM Blood pressure 114/70, pulse 77, temperature 98 F (36.7 C), temperature source Oral, resp. rate 16, last menstrual period 04/09/2020, SpO2 100 %. Maternal Exam:  Uterine Assessment: Contraction strength is mild.    Physical Exam  Prenatal labs: ABO, Rh: O/Positive/-- (01/25 1523) Antibody: Negative (01/25 1523) Rubella: <0.90 (01/25 1523) RPR: Non Reactive (05/20 0923)  HBsAg: Negative (01/25 1523)  HIV: Non Reactive (05/20 0923)  GBS:  Unknown  Assessment/Plan: 1. Vaginal bleeding in pregnancy, third trimester  2. [redacted] weeks gestation of pregnancy 3. Preterm uterine contractions - Admit to OBSCU - Routine antenatal admission orders (entered by Dr. Alysia Penna) - Dr. Alysia Penna assumes care of patient at the time of admission     Raelyn Mora, CNM 11/09/2020, 2:54 PM

## 2020-11-10 DIAGNOSIS — O4693 Antepartum hemorrhage, unspecified, third trimester: Secondary | ICD-10-CM

## 2020-11-10 DIAGNOSIS — Z3A30 30 weeks gestation of pregnancy: Secondary | ICD-10-CM | POA: Diagnosis not present

## 2020-11-10 DIAGNOSIS — Z3400 Encounter for supervision of normal first pregnancy, unspecified trimester: Secondary | ICD-10-CM

## 2020-11-10 DIAGNOSIS — O4700 False labor before 37 completed weeks of gestation, unspecified trimester: Secondary | ICD-10-CM | POA: Diagnosis not present

## 2020-11-10 LAB — GC/CHLAMYDIA PROBE AMP (~~LOC~~) NOT AT ARMC
Chlamydia: NEGATIVE
Comment: NEGATIVE
Comment: NORMAL
Neisseria Gonorrhea: NEGATIVE

## 2020-11-10 MED ORDER — LACTATED RINGERS IV BOLUS
1000.0000 mL | Freq: Once | INTRAVENOUS | Status: AC
  Administered 2020-11-10: 1000 mL via INTRAVENOUS

## 2020-11-10 NOTE — Progress Notes (Signed)
Patient ID: Mary Vincent, female   DOB: 2000-07-07, 20 y.o.   MRN: [id number] [facility] ANTEPARTUM COMPREHENSIVE PROGRESS NOTE  Mary Vincent is a 20 y.o. G1P0000 at [redacted]w[redacted]d  who is admitted for post coital bleeding in the third trimester.   Fetal presentation is cephalic. Length of Stay:  1  Days  Subjective: Pt denies any VB or bloody discharge this morning Patient reports good fetal movement.  She reports no uterine contractions, no bleeding and no loss of fluid per vagina.  Vitals:  Blood pressure 114/70, pulse 64, temperature 97.8 F (36.6 C), temperature source Oral, resp. rate 16, last menstrual period 04/09/2020, SpO2 100 %. Physical Examination: Lungs clear Heart RRR Abd soft, + BS, gravid, non tender Ext non tender  Fetal Monitoring:  140-150's, + acels, no decels, occ ut ctx  Labs:  Results for orders placed or performed during the hospital encounter of 11/09/20 (from the past 24 hour(s))  Wet prep, genital   Collection Time: 11/09/20  1:34 PM   Specimen: Cervix  Result Value Ref Range   Yeast Wet Prep HPF POC NONE SEEN NONE SEEN   Trich, Wet Prep NONE SEEN NONE SEEN   Clue Cells Wet Prep HPF POC PRESENT (A) NONE SEEN   WBC, Wet Prep HPF POC MODERATE (A) NONE SEEN   Sperm NONE SEEN   Resp Panel by RT-PCR (Flu A&B, Covid) Nasopharyngeal Swab   Collection Time: 11/09/20  3:38 PM   Specimen: Nasopharyngeal Swab; Nasopharyngeal(NP) swabs in vial transport medium  Result Value Ref Range   SARS Coronavirus 2 by RT PCR NEGATIVE NEGATIVE   Influenza A by PCR NEGATIVE NEGATIVE   Influenza B by PCR NEGATIVE NEGATIVE  Type and screen [facility]   Collection Time: 11/09/20  3:38 PM  Result Value Ref Range   ABO/RH(D) O POS    Antibody Screen NEG    Sample Expiration      11/12/2020,2359 Performed at [facility], [address] [place], [place] [address]   CBC   Collection Time: 11/09/20  5:02 PM  Result Value Ref Range   WBC 11.3 (H)  4.0 - 10.5 K/uL   RBC 3.92 3.87 - 5.11 MIL/uL   Hemoglobin 10.7 (L) 12.0 - 15.0 g/dL   HCT 42.5 (L) 95.6 - 38.7 %   MCV 82.4 80.0 - 100.0 fL   MCH 27.3 26.0 - 34.0 pg   MCHC 33.1 30.0 - 36.0 g/dL   RDW 56.4 33.2 - 95.1 %   Platelets 168 150 - 400 K/uL   nRBC 0.0 0.0 - 0.2 %    Imaging Studies:    See U/S report   Medications:  Scheduled . docusate sodium  100 mg Oral Daily  . prenatal multivitamin  1 tablet Oral Q1200  . sodium chloride flush  3 mL Intravenous Q12H   I have reviewed the patient's current medications.  ASSESSMENT: IUP 30 5/7  Post coital bleeding in third trimester   PLAN: Stable. No further evidence of bleeding since admission. Continue routine antenatal care.   Mary Vincent 11/10/2020,7:12 AM

## 2020-11-10 NOTE — Progress Notes (Signed)
Patient ID: Mary Vincent, female   DOB: 04/18/2001, 20 y.o.   MRN: [id number] Patient requested to see provider with questions. She reports noticing pink staining on pad this morning. Patient is without any other complaints. She denies cramping or contractions.  Blood pressure 112/70, pulse 73, temperature 98.3 F (36.8 C), temperature source Oral, resp. rate 16, last menstrual period 04/09/2020, SpO2 100 %. GENERAL: Well-developed, well-nourished female in no acute distress.  ABDOMEN: Soft, nontender, gravid PELVIC: Not indicated EXTREMITIES: No cyanosis, clubbing, or edema, 2+ distal pulses.  FHT: baseline 140, mod variability, + accels, no decels Toco: uterine irritability  A/P 20 yo G1P0 at [redacted]w[redacted]d admitted with third trimester vaginal bleeding - Given new onset of vaginal spotting this am discussed benefits of inpatient observations - Patient advised to monitor staining/bleeding episodes and to alert nurse when noted - All questions were answered - Continue routine care

## 2020-11-11 ENCOUNTER — Telehealth: Payer: Self-pay | Admitting: Family Medicine

## 2020-11-11 DIAGNOSIS — O4693 Antepartum hemorrhage, unspecified, third trimester: Secondary | ICD-10-CM | POA: Diagnosis not present

## 2020-11-11 DIAGNOSIS — Z3A30 30 weeks gestation of pregnancy: Secondary | ICD-10-CM | POA: Diagnosis not present

## 2020-11-11 DIAGNOSIS — Z3400 Encounter for supervision of normal first pregnancy, unspecified trimester: Secondary | ICD-10-CM | POA: Diagnosis not present

## 2020-11-11 DIAGNOSIS — O4700 False labor before 37 completed weeks of gestation, unspecified trimester: Secondary | ICD-10-CM | POA: Diagnosis not present

## 2020-11-11 NOTE — Discharge Instructions (Signed)
Vaginal Bleeding During Pregnancy, Third Trimester A small amount of bleeding from the vagina, or spotting, is common during pregnancy. Sometimes bleeding is normal and is not a problem. However, bleeding during the third trimester can also be a sign of something serious for the mother and the baby. Some normal things may cause bleeding or spotting during the third trimester. They include:  Rapid changes in blood vessels. This is caused by changes that are happening to the body during pregnancy.  Sex.  Pelvic exams. Some abnormal causes of vaginal bleeding during the third trimester include:  Infection in the cervix.  Growths on the cervix. The growths on the cervix are also called polyps.  A condition in which the placenta partially or completely covers the opening of the cervix inside the uterus (placenta previa).  The placenta separating from the uterus (placenta abruption).  Early labor, also called preterm labor.  A condition in which the placenta grows into the muscle of the uterus (placenta accreta). Tell your health care provider about any vaginal bleeding right away. Follow these instructions at home: Monitoring your bleeding  Pay attention to any changes in your symptoms. Let your health care provider know about any concerns.  Try to understand when the bleeding occurs. Does the bleeding start on its own, or does it start after something is done, such as sex or a pelvic exam?  Use a diary to record the things you see about your bleeding, including: ? The kind of bleeding you are having. Does the bleeding start and stop irregularly, or is it a Mary Vincent flow? ? The severity of the bleeding. Is the bleeding heavy or light? ? The number of pads you use each day, how often you change them, and how soaked they are.  Tell your health care provider if you pass tissue. He or she may want to see it.   Activity  Follow instructions from your health care provider about limiting  your activity. If your health care provider recommends activity restriction, you may need to stay in bed and only get up to use the bathroom. In some cases, your health care provider may allow you to continue light activity.  Ask your health care provider if it is safe for you to drive.  Do not lift anything that is heavier than 10 lb (4.5 kg), or the limit that you are told, until your health care provider says that it is safe.  Do not have sex until your health care provider says that this is safe.  If needed, make plans for someone to help with your regular activities. Medicines  Take over-the-counter and prescription medicines only as told by your health care provider.  Do not take aspirin because it can cause bleeding. General instructions  Do not use tampons or douche.  Keep all follow-up visits. This is important. Contact a health care provider if:  You have vaginal bleeding during any part of your pregnancy.  You have cramps or labor pains.  You have a fever. Get help right away if:  You have severe cramps or pain in your back or abdomen.  You have a gush of fluid from the vagina.  Your bleeding increases or you pass large clots or a large amount of tissue from your vagina.  You feel light-headed or weak, or you faint.  You feel that your baby is moving less than usual, or not moving at all. Summary  Some normal things can cause bleeding or spotting in pregnancy.  Bleeding during the third trimester can be a sign of a serious problem for the mother and the baby.  Be sure to tell your health care provider about any vaginal bleeding right away. This information is not intended to replace advice given to you by your health care provider. Make sure you discuss any questions you have with your health care provider. Document Revised: 02/14/2020 Document Reviewed: 02/14/2020 Elsevier Patient Education  2021 [employer].

## 2020-11-11 NOTE — Telephone Encounter (Signed)
Patient state she is in the Hospital and they saying she need to stay for 5 days she want someone from this office to cal her

## 2020-11-11 NOTE — Telephone Encounter (Signed)
Spoke with patient and pt informed me that she is in the hospital and wants to know if she can go home.  Per chart review, pt admitted for vaginal bleeding.  I advised pt that the person to speak with would be the provider who is currently in the hospital that they can explain to her why she should continue to stay.  Pt reports that she has been there for two days and wants to know can she evaluated by making an appt with [place] the same as she is in the hospital.  I advised pt that she is in the right place if any concerns arouse we would have to send her to the hospital.  I encouraged pt to speak with the in house provider before deciding to leave AMA.  Pt verbalized understanding with no further questions.   Addison Naegeli, RN  11/11/20

## 2020-11-11 NOTE — Discharge Summary (Signed)
Physician Discharge Summary  Patient ID: Mary Vincent MRN: [id number] DOB/AGE: [birth year] 20 y.o.  Admit date: 11/09/2020 Discharge date: 11/11/2020  Admission Diagnoses:  Discharge Diagnoses:  Active Problems:   Vaginal bleeding in pregnancy, third trimester   Discharged Condition: fair  Hospital Course: Patient admitted for observation following an episode of vaginal bleeding in third trimester which was enough to soak through underwear. Patient had a normal ultrasound and on HD #2 vaginal bleeding was absent. Maternal fetal status remained stable. Plan was for 5-7 day observation but patient requested to be discharge. Risks of leaving against medical advise was reviewed with the patient including but not limited to risks of placental abruption and preterm labor associated with possible fetal demise was explained to the patient. Precautions reviewed with the patient. Patient is scheduled for routine prenatal care on 6/9  Consults: None   Discharge Exam: Blood pressure (!) 106/59, pulse 75, temperature 98.2 F (36.8 C), temperature source Oral, resp. rate 18, last menstrual period 04/09/2020, SpO2 100 %. See previous progress note  Disposition:  There are no questions and answers to display.         Allergies as of 11/11/2020   No Known Allergies     Medication List    TAKE these medications   Ferrous Gluconate 324 (37.5 Fe) MG Tabs Take 1 tablet (324 mg total) by mouth every other day.   promethazine 12.5 MG tablet Commonly known as: PHENERGAN Take 1 tablet (12.5 mg total) by mouth every 6 (six) hours as needed for nausea or vomiting.   Vitafol Gummies 3.33-0.333-34.8 MG Chew Chew 1 Dose by mouth daily. What changed: how much to take       Follow-up Information    Center for [facility] Follow up.   Specialty: Obstetrics and Gynecology Why: As scheduled on June 9 Contact information: [address] [place]  [place] [address] [phone]              Signed: Catalina Antigua 11/11/2020, 3:16 PM

## 2020-11-11 NOTE — Progress Notes (Signed)
Patient ID: Mary Vincent, female   DOB: 08-27-2000, 20 y.o.   MRN: [id number] ACULTY PRACTICE ANTEPARTUM COMPREHENSIVE PROGRESS NOTE  Mary Vincent is a 20 y.o. G1P0000 at [redacted]w[redacted]d  who is admitted with bleeding in the third trimester.   Fetal presentation is cephalic. Length of Stay:  2  Days  Subjective: Patient denies any further episodes of vaginal bleeding since yesterday morning. Patient reports good fetal movement.  She reports no uterine contractions, no bleeding and no loss of fluid per vagina.  Vitals:  Blood pressure (!) 106/59, pulse 75, temperature 98.2 F (36.8 C), temperature source Oral, resp. rate 18, last menstrual period 04/09/2020, SpO2 100 %. Physical Examination: Lungs clear Heart RRR Abd soft, + BS, gravid, non tender Ext non tender  Fetal Monitoring:  Baseline: 145 bpm, Variability: Good {> 6 bpm), Accelerations: Reactive and Decelerations: Absent  Toco: uterine irritability  Labs:  No results found for this or any previous visit (from the past 24 hour(s)).  Imaging Studies:    See U/S report   Medications:  Scheduled . docusate sodium  100 mg Oral Daily  . prenatal multivitamin  1 tablet Oral Q1200  . sodium chloride flush  3 mL Intravenous Q12H   I have reviewed the patient's current medications.  ASSESSMENT/PLAN: IUP 30 6/7 with bleeding in third trimester Maternal fetal status stable Continue inpatient observation of vaginal bleeding until Friday if remain stable Continue routine antenatal care.   Mary Vincent 11/11/2020,9:02 AM

## 2020-11-13 ENCOUNTER — Ambulatory Visit (INDEPENDENT_AMBULATORY_CARE_PROVIDER_SITE_OTHER): Payer: Medicaid Other | Admitting: Certified Nurse Midwife

## 2020-11-13 ENCOUNTER — Other Ambulatory Visit: Payer: Self-pay

## 2020-11-13 VITALS — BP 99/63 | HR 78 | Wt 144.0 lb

## 2020-11-13 DIAGNOSIS — Z3A31 31 weeks gestation of pregnancy: Secondary | ICD-10-CM

## 2020-11-13 DIAGNOSIS — O4693 Antepartum hemorrhage, unspecified, third trimester: Secondary | ICD-10-CM

## 2020-11-13 DIAGNOSIS — Z3400 Encounter for supervision of normal first pregnancy, unspecified trimester: Secondary | ICD-10-CM

## 2020-11-13 NOTE — Patient Instructions (Signed)
Preterm Labor Pregnancy normally lasts 39-41 weeks. Preterm labor is when labor starts before you have been pregnant for 37 weeks. Babies who are born too early may have problems with blood sugar, body temperature, heart, and breathing. These problems may be very serious in babies who are born before 34 weeks of pregnancy. What are the causes? The cause of this condition is not known. What increases the risk? You are more likely to have preterm labor if:  You have medical problems, now or in the past.  You have problems now or in your past pregnancies.  You have lifestyle problems. Medical history  You have problems of the womb (uterus).  You have an infection, including infections you get from sex.  You have problems that do not go away, such as: ? Blood clots. ? High blood pressure. ? High blood sugar.  You have low body weight or too much body weight. Present and past pregnancies  You have had preterm labor before.  You are pregnant with two babies or more.  You have a condition in which the placenta covers your cervix.  You waited less than 6 months between giving birth and becoming pregnant again.  Your unborn baby has some problems.  You have bleeding from your vagina.  You became pregnant by a method called IVF. Lifestyle  You smoke.  You drink alcohol.  You use drugs.  You have stress.  You have abuse in your home.  You come in contact with chemicals that harm the body (pollutants). Other factors  You are younger than 17 years or older than 35 years. What are the signs or symptoms? Symptoms of this condition include:  Cramps. The cramps may feel like cramps from a period.  You may have watery poop (diarrhea).  Pain in the belly (abdomen).  Pain in the lower back.  Regular contractions. It may feel like your belly is getting tighter.  Pressure in the lower belly.  More fluid leaking from the vagina. The fluid may be watery or  bloody.  Water breaking. How is this treated? Treatment for this condition depends on your health, the health of your baby, and how old your pregnancy is. It may include:  Taking medicines, such as: ? Hormone medicines. ? Medicines to stop contractions. ? Medicines to help mature the baby's lungs. ? Medicines to prevent your baby from getting cerebral palsy.  Bed rest. If the labor happens before 34 weeks of pregnancy, you may need to stay in the hospital.  Delivering the baby. Follow these instructions at home:  Do not use any products that contain nicotine or tobacco, such as cigarettes, e-cigarettes, and chewing tobacco. If you need help quitting, ask your doctor.  Do not drink alcohol.  Take over-the-counter and prescription medicines only as told by your doctor.  Rest as told by your doctor.  Return to your activities as told by your doctor. Ask your doctor what activities are safe for you.  Keep all follow-up visits as told by your doctor. This is important.   How is this prevented? To have a healthy pregnancy:  Do not use street drugs.  Do not use any medicines unless you ask your doctor if they are safe for you.  Talk with your doctor before taking any herbal supplements.  Make sure you gain enough weight.  Watch for infection. If you think you might have an infection, get it checked right away. Symptoms of infection may include: ? Fever. ? Vaginal discharge. ?   Pain or burning when you pee. ? Needing to pee urgently. ? Needing to pee often. ? Peeing small amounts often. ? Blood in your pee. ? Pee that smells bad or unusual.  Tell your doctor if you have gone into preterm labor before. Contact a doctor if:  You think you are going into preterm labor.  You have symptoms of preterm labor.  You have symptoms of infection. Get help right away if:  You are having painful contractions every 5 minutes or less.  Your water breaks. Summary  Preterm labor  is labor that starts before you reach 37 weeks of pregnancy.  Your baby may have problems if delivered early.  The cause of preterm labor is not known. Having problems of the womb (uterus), an infection, or bleeding during pregnancy increases the risk.  Contact a doctor if you have signs or symptoms of preterm labor. This information is not intended to replace advice given to you by your health care provider. Make sure you discuss any questions you have with your health care provider. Document Revised: 06/26/2019 Document Reviewed: 06/26/2019 Elsevier Patient Education  2021 Elsevier Inc.  

## 2020-11-13 NOTE — Progress Notes (Signed)
   PRENATAL VISIT NOTE  Subjective:  Mary Vincent is a 20 y.o. G1P0000 at [redacted]w[redacted]d being seen today for ongoing prenatal care.  She is currently monitored for the following issues for this low-risk pregnancy and has Oppositional defiant disorder; Supervision of normal first pregnancy, antepartum; Rubella non-immune status, antepartum; and Vaginal bleeding in pregnancy, third trimester on their problem list.  Patient reports no bleeding and occasional Braxton-Hicks, as well as occasional pain in her pubic bone/groin. The tightness and pelvic pain are not happening at the same time .  Contractions: Irritability. Vag. Bleeding: None.  Movement: Present. Denies leaking of fluid.   The following portions of the patient's history were reviewed and updated as appropriate: allergies, current medications, past family history, past medical history, past social history, past surgical history and problem list.   Objective:   Vitals:   11/13/20 1456  BP: 99/63  Pulse: 78  Weight: 144 lb (65.3 kg)    Fetal Status: Fetal Heart Rate (bpm): 147 Fundal Height: 31 cm Movement: Present     General:  Alert, oriented and cooperative. Patient is in no acute distress.  Skin: Skin is warm and dry. No rash noted.   Cardiovascular: Normal heart rate noted  Respiratory: Normal respiratory effort, no problems with respiration noted  Abdomen: Soft, gravid, appropriate for gestational age.  Pain/Pressure: Present     Pelvic: Cervical exam deferred        Extremities: Normal range of motion.  Edema: None  Mental Status: Normal mood and affect. Normal behavior. Normal judgment and thought content.   Assessment and Plan:  Pregnancy: G1P0000 at [redacted]w[redacted]d 1. Supervision of normal first pregnancy, antepartum - Doing well, feeling vigorous fetal movement  2. [redacted] weeks gestation of pregnancy - Routine OB care  3. Vaginal bleeding in pregnancy, third trimester - Pt admitted earlier this week to [facility] for observation after  an episode of vaginal bleeding. By day 2, the bleeding had stopped and pt requested to be discharged. Has kept herself on moderate bed rest since discharge until today. Has had no additional bleeding or cramping. - Advised she can go back to normal activities but to listen carefully to her body and take it easy if she starts having pain or contractions. - Advised strongly to return to MAU immediately if she has any additional bleeding, pt expressed understanding and agreed to plan  Preterm labor symptoms and general obstetric precautions including but not limited to vaginal bleeding, contractions, leaking of fluid and fetal movement were reviewed in detail with the patient. Please refer to After Visit Summary for other counseling recommendations.   Return in about 2 weeks (around 11/27/2020) for IN-PERSON, LOB.  Bernerd Limbo, CNM

## 2020-11-17 ENCOUNTER — Encounter: Payer: Self-pay | Admitting: *Deleted

## 2020-11-25 ENCOUNTER — Ambulatory Visit (INDEPENDENT_AMBULATORY_CARE_PROVIDER_SITE_OTHER): Payer: Medicaid Other

## 2020-11-25 ENCOUNTER — Other Ambulatory Visit: Payer: Self-pay

## 2020-11-25 VITALS — BP 109/74 | HR 73 | Wt 149.3 lb

## 2020-11-25 DIAGNOSIS — O99013 Anemia complicating pregnancy, third trimester: Secondary | ICD-10-CM

## 2020-11-25 DIAGNOSIS — Z3400 Encounter for supervision of normal first pregnancy, unspecified trimester: Secondary | ICD-10-CM

## 2020-11-25 DIAGNOSIS — Z3A32 32 weeks gestation of pregnancy: Secondary | ICD-10-CM

## 2020-11-25 DIAGNOSIS — Z5941 Food insecurity: Secondary | ICD-10-CM

## 2020-11-25 NOTE — Progress Notes (Signed)
   PRENATAL VISIT NOTE  Subjective:  Mary Vincent is a 20 y.o. G1P0000 at [redacted]w[redacted]d being seen today for ongoing prenatal care. She is currently monitored for the following issues for this low-risk pregnancy and has Oppositional defiant disorder; Supervision of normal first pregnancy, antepartum; Rubella non-immune status, antepartum; and Vaginal bleeding in pregnancy, third trimester on their problem list.  Patient reports no complaints.  Has intermittent sharp pain in legs and pelvis that resolves without intervention. Has been ongoing since last appointment. Pain is worse with movement. Contractions: Irritability. Vag. Bleeding: None.  Movement: Present. Denies leaking of fluid.   The following portions of the patient's history were reviewed and updated as appropriate: allergies, current medications, past family history, past medical history, past social history, past surgical history and problem list.   Objective:   Vitals:   11/25/20 0954  BP: 109/74  Pulse: 73  Weight: 149 lb 4.8 oz (67.7 kg)    Fetal Status: Fetal Heart Rate (bpm): 142 Fundal Height: 33 cm Movement: Present     General:  Alert, oriented and cooperative. Patient is in no acute distress.  Skin: Skin is warm and dry. No rash noted.   Cardiovascular: Normal heart rate noted  Respiratory: Normal respiratory effort, no problems with respiration noted  Abdomen: Soft, gravid, appropriate for gestational age.  Pain/Pressure: Present     Pelvic: Cervical exam deferred        Extremities: Normal range of motion.  Edema: None  Mental Status: Normal mood and affect. Normal behavior. Normal judgment and thought content.   Assessment and Plan:  Pregnancy: G1P0000 at [redacted]w[redacted]d  1. Food insecurity  - AMBULATORY REFERRAL TO [facility]  2. [redacted] weeks gestation of pregnancy - Reassurance provided on normalcy of symptoms  3. Supervision of normal first pregnancy, antepartum - Doing well.  - Routine OB care -  Anticipatory guidance provided for upcoming appointments  4. Anemia affecting pregnancy in third trimester - Not taking iron supplement as prescribed. - Encouraged patient to start taking. Says she will pick up today   Preterm labor symptoms and general obstetric precautions including but not limited to vaginal bleeding, contractions, leaking of fluid and fetal movement were reviewed in detail with the patient. Please refer to After Visit Summary for other counseling recommendations.   Return in about 2 weeks (around 12/09/2020).  Future Appointments  Date Time Provider Department Center  12/09/2020  9:35 AM Mary Vincent, CNM [facility]    Mary Vincent, CNM 11/25/20 11:42 AM

## 2020-11-25 NOTE — Progress Notes (Signed)
Patient complains of "sharp" pains when she is walking or when baby is moving. Patient denies any vaginal bleeding

## 2020-12-09 ENCOUNTER — Encounter: Payer: Medicaid Other | Admitting: Certified Nurse Midwife

## 2020-12-22 ENCOUNTER — Encounter: Payer: Self-pay | Admitting: Nurse Practitioner

## 2020-12-22 ENCOUNTER — Ambulatory Visit (INDEPENDENT_AMBULATORY_CARE_PROVIDER_SITE_OTHER): Payer: Medicaid Other | Admitting: Nurse Practitioner

## 2020-12-22 ENCOUNTER — Other Ambulatory Visit (HOSPITAL_COMMUNITY)
Admission: RE | Admit: 2020-12-22 | Discharge: 2020-12-22 | Disposition: A | Discharge status: Home / Self Care | Payer: Medicaid Other | Source: Ambulatory Visit | Attending: Nurse Practitioner | Admitting: Nurse Practitioner

## 2020-12-22 VITALS — BP 116/68 | HR 65 | Wt 143.4 lb

## 2020-12-22 DIAGNOSIS — Z3400 Encounter for supervision of normal first pregnancy, unspecified trimester: Secondary | ICD-10-CM | POA: Diagnosis not present

## 2020-12-22 NOTE — Progress Notes (Signed)
    Subjective:  Mary Vincent is a 20 y.o. G1P0000 at [redacted]w[redacted]d being seen today for ongoing prenatal care.  She is currently monitored for the following issues for this low-risk pregnancy and has Oppositional defiant disorder; Supervision of normal first pregnancy, antepartum; Rubella non-immune status, antepartum; and Vaginal bleeding in pregnancy, third trimester on their problem list.  Patient reports no complaints.  Contractions: Irritability. Vag. Bleeding: None.  Movement: Present. Denies leaking of fluid.   The following portions of the patient's history were reviewed and updated as appropriate: allergies, current medications, past family history, past medical history, past social history, past surgical history and problem list. Problem list updated.  Objective:   Vitals:   12/22/20 1002  BP: 116/68  Pulse: 65  Weight: 143 lb 6.4 oz (65 kg)    Fetal Status: Fetal Heart Rate (bpm): 152 Fundal Height: 35 cm Movement: Present  Presentation: Vertex  General:  Alert, oriented and cooperative. Patient is in no acute distress.  Skin: Skin is warm and dry. No rash noted.   Cardiovascular: Normal heart rate noted  Respiratory: Normal respiratory effort, no problems with respiration noted  Abdomen: Soft, gravid, appropriate for gestational age. Pain/Pressure: Present     Pelvic:  Cervical exam performed Dilation: 1 Effacement (%): Thick Station: -2  Extremities: Normal range of motion.  Edema: None  Mental Status: Normal mood and affect. Normal behavior. Normal judgment and thought content.   Urinalysis:      Assessment and Plan:  Pregnancy: G1P0000 at [redacted]w[redacted]d  1. Supervision of normal first pregnancy, antepartum Encouraged to call her pediatrician Considering contraception options - may want PP IUD - info given on IUD today Advised spacing babies and not conceiving again until this baby is 15 months of age Has only used pulling out as her method previously Left without scheduling  her next appointment - message sent to admin staff to schedule. Plans to breastfeed some and mostly bottle feed.  Advised to call [facility] for breastfeeding class - reviewed formula shortage and reviewed skin to skin at birth and baby rooting for the breast shortly after birth. Given info on IUD in AVS but left without checking out so likely did not get her info  - Culture, beta strep (group b only) - Cervicovaginal ancillary only( [place])  Term labor symptoms and general obstetric precautions including but not limited to vaginal bleeding, contractions, leaking of fluid and fetal movement were reviewed in detail with the patient. Please refer to After Visit Summary for other counseling recommendations.  Return in about 1 week (around 12/29/2020) for in person ROB.  Nolene Bernheim, RN, MSN, NP-BC Nurse Practitioner, [facility] for [employer], [facility] 12/22/2020 10:43 AM

## 2020-12-23 LAB — CERVICOVAGINAL ANCILLARY ONLY
Chlamydia: NEGATIVE
Comment: NEGATIVE
Comment: NORMAL
Neisseria Gonorrhea: NEGATIVE

## 2020-12-26 LAB — CULTURE, BETA STREP (GROUP B ONLY): Strep Gp B Culture: POSITIVE — AB

## 2020-12-29 ENCOUNTER — Encounter: Payer: Self-pay | Admitting: Nurse Practitioner

## 2020-12-29 DIAGNOSIS — O99820 Streptococcus B carrier state complicating pregnancy: Secondary | ICD-10-CM | POA: Insufficient documentation

## 2020-12-31 ENCOUNTER — Ambulatory Visit (INDEPENDENT_AMBULATORY_CARE_PROVIDER_SITE_OTHER): Payer: Medicaid Other | Admitting: Certified Nurse Midwife

## 2020-12-31 ENCOUNTER — Other Ambulatory Visit: Payer: Self-pay

## 2020-12-31 ENCOUNTER — Encounter (HOSPITAL_COMMUNITY): Payer: Self-pay | Admitting: Obstetrics and Gynecology

## 2020-12-31 ENCOUNTER — Inpatient Hospital Stay (HOSPITAL_COMMUNITY)
Admission: AD | Admit: 2020-12-31 | Discharge: 2021-01-03 | DRG: 805 | Disposition: A | Discharge status: Home / Self Care | Payer: Medicaid Other | Attending: Obstetrics and Gynecology | Admitting: Obstetrics and Gynecology

## 2020-12-31 ENCOUNTER — Inpatient Hospital Stay (HOSPITAL_COMMUNITY)
Admission: AD | Admit: 2020-12-31 | Discharge: 2020-12-31 | Disposition: A | Discharge status: Home / Self Care | Payer: Medicaid Other | Source: Home / Self Care | Attending: Obstetrics and Gynecology | Admitting: Obstetrics and Gynecology

## 2020-12-31 VITALS — Wt 146.1 lb

## 2020-12-31 DIAGNOSIS — Z8616 Personal history of COVID-19: Secondary | ICD-10-CM | POA: Diagnosis present

## 2020-12-31 DIAGNOSIS — Z3A38 38 weeks gestation of pregnancy: Secondary | ICD-10-CM

## 2020-12-31 DIAGNOSIS — O99824 Streptococcus B carrier state complicating childbirth: Principal | ICD-10-CM | POA: Diagnosis present

## 2020-12-31 DIAGNOSIS — O471 False labor at or after 37 completed weeks of gestation: Secondary | ICD-10-CM | POA: Insufficient documentation

## 2020-12-31 DIAGNOSIS — O99820 Streptococcus B carrier state complicating pregnancy: Secondary | ICD-10-CM

## 2020-12-31 DIAGNOSIS — U071 COVID-19: Secondary | ICD-10-CM | POA: Diagnosis present

## 2020-12-31 DIAGNOSIS — O479 False labor, unspecified: Secondary | ICD-10-CM

## 2020-12-31 DIAGNOSIS — O09899 Supervision of other high risk pregnancies, unspecified trimester: Secondary | ICD-10-CM

## 2020-12-31 DIAGNOSIS — O9852 Other viral diseases complicating childbirth: Secondary | ICD-10-CM | POA: Diagnosis present

## 2020-12-31 DIAGNOSIS — Z2839 Other underimmunization status: Secondary | ICD-10-CM

## 2020-12-31 DIAGNOSIS — Z3400 Encounter for supervision of normal first pregnancy, unspecified trimester: Secondary | ICD-10-CM

## 2020-12-31 DIAGNOSIS — D563 Thalassemia minor: Secondary | ICD-10-CM | POA: Diagnosis present

## 2020-12-31 DIAGNOSIS — Z3490 Encounter for supervision of normal pregnancy, unspecified, unspecified trimester: Secondary | ICD-10-CM

## 2020-12-31 DIAGNOSIS — Z3493 Encounter for supervision of normal pregnancy, unspecified, third trimester: Secondary | ICD-10-CM

## 2020-12-31 NOTE — Progress Notes (Signed)
S: Ms. Mary Vincent is a 20 y.o. G1P0000 at [redacted]w[redacted]d  who presents to MAU today for labor evaluation.     Cervical exam by RN:  Dilation: 3 Effacement (%): 80 Cervical Position: Middle Station: -2 Presentation: Vertex Exam by:: n druebbisch rn  Fetal Monitoring: Baseline: 145 BPM Variability: mod Accelerations: +accels Decelerations: -decels Contractions: q6-50min  MDM Discussed patient with RN. NST reviewed.   A: SIUP at [redacted]w[redacted]d  False labor  P: Discharge home Labor precautions and kick counts included in AVS Patient to follow-up with [facility] as scheduled  Patient may return to MAU as needed or when in labor   Alfredo Martinez, MD 12/31/2020 11:33 AM

## 2020-12-31 NOTE — Patient Instructions (Signed)

## 2021-01-01 ENCOUNTER — Institutional Professional Consult (permissible substitution): Payer: Medicaid Other | Admitting: Pediatrics

## 2021-01-01 ENCOUNTER — Inpatient Hospital Stay (HOSPITAL_COMMUNITY): Payer: Medicaid Other | Admitting: Anesthesiology

## 2021-01-01 ENCOUNTER — Encounter (HOSPITAL_COMMUNITY): Payer: Self-pay | Admitting: Obstetrics and Gynecology

## 2021-01-01 DIAGNOSIS — O9852 Other viral diseases complicating childbirth: Secondary | ICD-10-CM | POA: Diagnosis present

## 2021-01-01 DIAGNOSIS — Z3A38 38 weeks gestation of pregnancy: Secondary | ICD-10-CM | POA: Diagnosis not present

## 2021-01-01 DIAGNOSIS — O99824 Streptococcus B carrier state complicating childbirth: Secondary | ICD-10-CM | POA: Diagnosis present

## 2021-01-01 DIAGNOSIS — O26893 Other specified pregnancy related conditions, third trimester: Secondary | ICD-10-CM | POA: Diagnosis present

## 2021-01-01 DIAGNOSIS — U071 COVID-19: Secondary | ICD-10-CM | POA: Diagnosis present

## 2021-01-01 DIAGNOSIS — D563 Thalassemia minor: Secondary | ICD-10-CM | POA: Diagnosis present

## 2021-01-01 DIAGNOSIS — Z8616 Personal history of COVID-19: Secondary | ICD-10-CM | POA: Diagnosis present

## 2021-01-01 DIAGNOSIS — O99820 Streptococcus B carrier state complicating pregnancy: Secondary | ICD-10-CM | POA: Diagnosis not present

## 2021-01-01 LAB — TYPE AND SCREEN
ABO/RH(D): O POS
Antibody Screen: NEGATIVE

## 2021-01-01 LAB — CBC
HCT: 33 % — ABNORMAL LOW (ref 36.0–46.0)
Hemoglobin: 10.7 g/dL — ABNORMAL LOW (ref 12.0–15.0)
MCH: 25.2 pg — ABNORMAL LOW (ref 26.0–34.0)
MCHC: 32.4 g/dL (ref 30.0–36.0)
MCV: 77.8 fL — ABNORMAL LOW (ref 80.0–100.0)
Platelets: 148 10*3/uL — ABNORMAL LOW (ref 150–400)
RBC: 4.24 MIL/uL (ref 3.87–5.11)
RDW: 14 % (ref 11.5–15.5)
WBC: 6.4 10*3/uL (ref 4.0–10.5)
nRBC: 0 % (ref 0.0–0.2)

## 2021-01-01 LAB — RESP PANEL BY RT-PCR (FLU A&B, COVID) ARPGX2
Influenza A by PCR: NEGATIVE
Influenza B by PCR: NEGATIVE
SARS Coronavirus 2 by RT PCR: POSITIVE — AB

## 2021-01-01 LAB — RPR: RPR Ser Ql: NONREACTIVE

## 2021-01-01 MED ORDER — TETANUS-DIPHTH-ACELL PERTUSSIS 5-2.5-18.5 LF-MCG/0.5 IM SUSY: 0.5000 mL | PREFILLED_SYRINGE | Freq: Once | INTRAMUSCULAR | Status: DC

## 2021-01-01 MED ORDER — DIBUCAINE (PERIANAL) 1 % EX OINT
1.0000 | TOPICAL_OINTMENT | CUTANEOUS | Status: DC | PRN
Start: 2021-01-01 — End: 2021-01-03

## 2021-01-01 MED ORDER — PRENATAL MULTIVITAMIN CH
1.0000 | ORAL_TABLET | Freq: Every day | ORAL | Status: DC
Start: 2021-01-01 — End: 2021-01-03
  Administered 2021-01-01 – 2021-01-03 (×3): 1 via ORAL
  Filled 2021-01-01 (×3): qty 1

## 2021-01-01 MED ORDER — LACTATED RINGERS IV SOLN: 500.0000 mL | INTRAVENOUS | Status: DC | PRN

## 2021-01-01 MED ORDER — DIPHENHYDRAMINE HCL 50 MG/ML IJ SOLN: 12.5000 mg | INTRAMUSCULAR | Status: DC | PRN

## 2021-01-01 MED ORDER — FLEET ENEMA 7-19 GM/118ML RE ENEM: 1.0000 | ENEMA | RECTAL | Status: DC | PRN

## 2021-01-01 MED ORDER — SOD CITRATE-CITRIC ACID 500-334 MG/5ML PO SOLN: 30.0000 mL | ORAL | Status: DC | PRN

## 2021-01-01 MED ORDER — ONDANSETRON HCL 4 MG/2ML IJ SOLN: 4.0000 mg | INTRAMUSCULAR | Status: DC | PRN

## 2021-01-01 MED ORDER — OXYTOCIN-SODIUM CHLORIDE 30-0.9 UT/500ML-% IV SOLN
2.5000 [IU]/h | INTRAVENOUS | Status: DC
  Administered 2021-01-01: 2.5 [IU]/h via INTRAVENOUS
  Filled 2021-01-01: qty 500

## 2021-01-01 MED ORDER — IBUPROFEN 600 MG PO TABS
600.0000 mg | ORAL_TABLET | Freq: Four times a day (QID) | ORAL | Status: DC
Start: 2021-01-01 — End: 2021-01-03
  Administered 2021-01-01 – 2021-01-03 (×9): 600 mg via ORAL
  Filled 2021-01-01 (×9): qty 1

## 2021-01-01 MED ORDER — FENTANYL CITRATE (PF) 100 MCG/2ML IJ SOLN
INTRAMUSCULAR | Status: AC
  Filled 2021-01-01: qty 2

## 2021-01-01 MED ORDER — BENZOCAINE-MENTHOL 20-0.5 % EX AERO
1.0000 | INHALATION_SPRAY | CUTANEOUS | Status: DC | PRN
Start: 2021-01-01 — End: 2021-01-03

## 2021-01-01 MED ORDER — FENTANYL-BUPIVACAINE-NACL 0.5-0.125-0.9 MG/250ML-% EP SOLN
EPIDURAL | Status: AC
  Filled 2021-01-01: qty 250

## 2021-01-01 MED ORDER — LACTATED RINGERS IV SOLN: 500.0000 mL | Freq: Once | INTRAVENOUS | Status: DC

## 2021-01-01 MED ORDER — ZOLPIDEM TARTRATE 5 MG PO TABS
5.0000 mg | ORAL_TABLET | Freq: Every evening | ORAL | Status: DC | PRN
Start: 2021-01-01 — End: 2021-01-03

## 2021-01-01 MED ORDER — ACETAMINOPHEN 325 MG PO TABS
650.0000 mg | ORAL_TABLET | ORAL | Status: DC | PRN
  Administered 2021-01-02: 650 mg via ORAL
  Filled 2021-01-01: qty 2

## 2021-01-01 MED ORDER — WITCH HAZEL-GLYCERIN EX PADS
1.0000 | MEDICATED_PAD | CUTANEOUS | Status: DC | PRN
Start: 2021-01-01 — End: 2021-01-03

## 2021-01-01 MED ORDER — OXYCODONE-ACETAMINOPHEN 5-325 MG PO TABS: 1.0000 | ORAL_TABLET | ORAL | Status: DC | PRN

## 2021-01-01 MED ORDER — OXYCODONE-ACETAMINOPHEN 5-325 MG PO TABS: 2.0000 | ORAL_TABLET | ORAL | Status: DC | PRN

## 2021-01-01 MED ORDER — ACETAMINOPHEN 325 MG PO TABS: 650.0000 mg | ORAL_TABLET | ORAL | Status: DC | PRN

## 2021-01-01 MED ORDER — TERBUTALINE SULFATE 1 MG/ML IJ SOLN
INTRAMUSCULAR | Status: AC
  Filled 2021-01-01: qty 1

## 2021-01-01 MED ORDER — SENNOSIDES-DOCUSATE SODIUM 8.6-50 MG PO TABS
2.0000 | ORAL_TABLET | ORAL | Status: DC
Start: 2021-01-01 — End: 2021-01-03
  Administered 2021-01-01 – 2021-01-03 (×3): 2 via ORAL
  Filled 2021-01-01 (×3): qty 2

## 2021-01-01 MED ORDER — ONDANSETRON HCL 4 MG/2ML IJ SOLN
4.0000 mg | Freq: Four times a day (QID) | INTRAMUSCULAR | Status: DC | PRN
  Administered 2021-01-01: 4 mg via INTRAVENOUS
  Filled 2021-01-01: qty 2

## 2021-01-01 MED ORDER — SODIUM CHLORIDE 0.9 % IV SOLN
5.0000 10*6.[IU] | Freq: Once | INTRAVENOUS | Status: AC
  Administered 2021-01-01: 5 10*6.[IU] via INTRAVENOUS
  Filled 2021-01-01: qty 5

## 2021-01-01 MED ORDER — OXYCODONE HCL 5 MG PO TABS: 5.0000 mg | ORAL_TABLET | ORAL | Status: DC | PRN

## 2021-01-01 MED ORDER — LIDOCAINE HCL (PF) 1 % IJ SOLN: 30.0000 mL | INTRAMUSCULAR | Status: DC | PRN

## 2021-01-01 MED ORDER — SIMETHICONE 80 MG PO CHEW: 80.0000 mg | CHEWABLE_TABLET | ORAL | Status: DC | PRN

## 2021-01-01 MED ORDER — PHENYLEPHRINE 40 MCG/ML (10ML) SYRINGE FOR IV PUSH (FOR BLOOD PRESSURE SUPPORT)
80.0000 ug | PREFILLED_SYRINGE | INTRAVENOUS | Status: DC | PRN
  Administered 2021-01-01: 80 ug via INTRAVENOUS

## 2021-01-01 MED ORDER — OXYTOCIN BOLUS FROM INFUSION
333.0000 mL | Freq: Once | INTRAVENOUS | Status: AC
  Administered 2021-01-01: 333 mL via INTRAVENOUS

## 2021-01-01 MED ORDER — PHENYLEPHRINE 40 MCG/ML (10ML) SYRINGE FOR IV PUSH (FOR BLOOD PRESSURE SUPPORT)
80.0000 ug | PREFILLED_SYRINGE | INTRAVENOUS | Status: DC | PRN
  Filled 2021-01-01: qty 10

## 2021-01-01 MED ORDER — FENTANYL-BUPIVACAINE-NACL 0.5-0.125-0.9 MG/250ML-% EP SOLN
12.0000 mL/h | EPIDURAL | Status: DC | PRN
  Administered 2021-01-01: 12 mL/h via EPIDURAL

## 2021-01-01 MED ORDER — MEASLES, MUMPS & RUBELLA VAC IJ SOLR
0.5000 mL | Freq: Once | INTRAMUSCULAR | Status: DC
Start: 2021-01-02 — End: 2021-01-03

## 2021-01-01 MED ORDER — LACTATED RINGERS IV SOLN: INTRAVENOUS | Status: DC

## 2021-01-01 MED ORDER — EPHEDRINE 5 MG/ML INJ: 10.0000 mg | INTRAVENOUS | Status: DC | PRN

## 2021-01-01 MED ORDER — DIPHENHYDRAMINE HCL 25 MG PO CAPS: 25.0000 mg | ORAL_CAPSULE | Freq: Four times a day (QID) | ORAL | Status: DC | PRN

## 2021-01-01 MED ORDER — FENTANYL CITRATE (PF) 100 MCG/2ML IJ SOLN
50.0000 ug | INTRAMUSCULAR | Status: DC | PRN
  Administered 2021-01-01: 100 ug via INTRAVENOUS

## 2021-01-01 MED ORDER — ONDANSETRON HCL 4 MG PO TABS: 4.0000 mg | ORAL_TABLET | ORAL | Status: DC | PRN

## 2021-01-01 MED ORDER — LIDOCAINE HCL (PF) 1 % IJ SOLN
INTRAMUSCULAR | Status: DC | PRN
  Administered 2021-01-01: 3 mL via EPIDURAL
  Administered 2021-01-01: 7 mL via EPIDURAL

## 2021-01-01 MED ORDER — COCONUT OIL OIL: 1.0000 "application " | TOPICAL_OIL | Status: DC | PRN

## 2021-01-01 MED ORDER — PENICILLIN G POT IN DEXTROSE 60000 UNIT/ML IV SOLN
3.0000 10*6.[IU] | INTRAVENOUS | Status: DC
  Administered 2021-01-01: 3 10*6.[IU] via INTRAVENOUS
  Filled 2021-01-01: qty 50

## 2021-01-01 NOTE — H&P (Addendum)
OBSTETRIC ADMISSION HISTORY AND PHYSICAL  Mary Vincent is a 20 y.o. female G1P0000 with IUP at 32w1dby LMP presenting for contractions. She reports +FMs, No LOF, no VB, no blurry vision, headaches or peripheral edema, and RUQ pain.  She plans on bottle and breast feeding. She is undecided for birth control. 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 -GBS positive -Rubella non-immune  Past Medical History: Past Medical History:  Diagnosis Date   Anemia    Chlamydia    Chlamydia contact, treated    Ovarian cyst    Trichomonas infection     Past Surgical History: Past Surgical History:  Procedure Laterality Date   NO PAST SURGERIES     WISDOM TOOTH EXTRACTION      Obstetrical History: OB History     Gravida  1   Para  0   Term  0   Preterm  0   AB  0   Living  0      SAB  0   IAB  0   Ectopic  0   Multiple  0   Live Births              Social History Social History   Socioeconomic History   Marital status: Single    Spouse name: Not on file   Number of children: Not on file   Years of education: Not on file   Highest education level: Not on file  Occupational History   Not on file  Tobacco Use   Smoking status: Former    Types: Cigarettes    Quit date: 10/06/2012    Years since quitting: 8.2   Smokeless tobacco: Never  Vaping Use   Vaping Use: Never used  Substance and Sexual Activity   Alcohol use: Not Currently    Comment: weekends   Drug use: Not Currently    Frequency: 7.0 times per week    Types: Marijuana    Comment: last was 12/4   Sexual activity: Yes  Other Topics Concern   Not on file  Social History Narrative   Not on file   Social Determinants of Health   Financial Resource  Strain: Not on file  Food Insecurity: No Food Insecurity   Worried About RCharity fundraiserin the Last Year: Never true   Ran Out of Food in the Last Year: Never true  Transportation Needs: No Transportation Needs   Lack of Transportation (Medical): No   Lack of Transportation (Non-Medical): No  Physical Activity: Not on file  Stress: Not on file  Social Connections: Not on file    Family History: Family History  Problem Relation Age of Onset   Healthy Mother    Healthy Father    Hypertension Maternal Grandmother     Allergies: No Known Allergies  Medications Prior to Admission  Medication Sig Dispense Refill Last Dose   Prenatal Vit-Fe Fumarate-FA (MULTIVITAMIN-PRENATAL) 27-0.8 MG TABS tablet Take 1 tablet by mouth daily at 12 noon.        Review of Systems   All systems reviewed and negative except as stated in HPI  Blood pressure 127/86, pulse 79, temperature 97.8 F (36.6 C), temperature source Oral, resp. rate 16, last menstrual period 04/09/2020, SpO2  99 %. General appearance: alert, cooperative, and mild distress Lungs: clear to auscultation bilaterally Heart: regular rate and rhythm Abdomen: soft, non-tender; bowel sounds normal Pelvic: 5/90/0 per RN Extremities: Homans sign is negative, no sign of DVT Presentation: cephalic Fetal monitoringBaseline: 120 bpm, Variability: Good {> 6 bpm), Accelerations: Reactive, and Decelerations: Absent Uterine activity:q3 min Dilation: 5 Effacement (%): 90 Station: 0 Exam by:: Lenard Simmer, RN   Prenatal labs:  Nursing Staff Provider  Office Location [facility] Dating  LMP  Language  English Anatomy US   normal with EIC  Flu Vaccine  Declined 07/02/19 Genetic Screen  NIPS: low risk female  AFP: not done  First Screen:  Quad:    TDaP Vaccine   10/23/20 Hgb A1C or  GTT Early 117 Third trimester   COVID Vaccine Pfizer 04/29/20   LAB RESULTS   Rhogam  NA Blood Type O/Positive/-- (01/25 1523)   Feeding Plan Bottle Antibody  Negative (01/25 1523)  Contraception nothing Rubella <0.90 (01/25 1523)NON-IMMUNE  Circumcision NA female RPR Non Reactive (01/25 1523)   Pediatrician   HBsAg Negative (01/25 1523)   Support Person FOB HCVAb Negative  Prenatal Classes  HIV Non Reactive (01/25 1523)     BTL Consent NA GBS   (For PCN allergy, check sensitivities)   VBAC Consent NA Pap  normal 06/2020    Hgb Electro  Silent alpha thalassemia carrier  BP Cuff Ordered 07/01/20 CF     SMA     Waterbirth  _0  Class _1  Consent _2  CNM visit    Induction  _3  Orders Entered _4 Foley Y/N    Prenatal Transfer Tool  Maternal Diabetes: No Genetic Screening: Normal Maternal Ultrasounds/Referrals: Isolated EIF (echogenic intracardiac focus) Fetal Ultrasounds or other Referrals:  None Maternal Substance Abuse:  No Significant Maternal Medications:  None Significant Maternal Lab Results: Group B Strep positive, Alpha thalassemia silent carrier  No results found for this or any previous visit (from the past 24 hour(s)).  Patient Active Problem List   Diagnosis Date Noted   Group B Streptococcus carrier, +RV culture, currently pregnant 12/29/2020   Vaginal bleeding in pregnancy, third trimester    Rubella non-immune status, antepartum 07/03/2020   Oppositional defiant disorder 07/01/2020   Supervision of normal first pregnancy, antepartum 07/01/2020    Assessment/Plan:  Mary Vincent is a 20 y.o. G1P0000 at 7w1dhere for early labor  #Labor:Admitted with early labor, making adequate change continue expectant management #Pain: Epidural or IV pain medication as needed #FWB: Cat 1. Some minimal variability after fentanyl  #ID:  GBS positive, PCN #MOF: Breast and bottle #MOC:undecided, possibly IUD #Circ: Girl #Rubella NI: vaccinate pp #anemia: Hgb 10.7, PO iron pp  AWaldon Merl MD  01/01/2021, 12:34 AM  CNM attestation:  I have seen and examined this patient; I agree with above documentation in the resident's  note.   Mary Hawsis a 20y.o. G1P0000 here for SOL with preg remarkable for 1) GBS+ 2) rubella nonimmune.  PE: BP 126/68   Pulse 85   Temp 97.8 F (36.6 C) (Oral)   Resp 16   Ht _5  (1.651 m)   Wt 66.3 kg   LMP 04/09/2020   SpO2 100%   BMI 24.31 kg/m  Gen: breathing w ctx Resp: normal effort, no distress Abd: gravid  ROS, labs, PMH reviewed  Plan: Admit to Labor and Delivery Plan expectant management; may augment prn PCN for GBS ppx Anticipate vag del Recommend MMR pp  Myrtis Ser CNM 01/01/2021, 3:15 AM

## 2021-01-01 NOTE — Anesthesia Preprocedure Evaluation (Signed)
Anesthesia Evaluation  Patient identified by MRN, date of birth, ID band Patient awake    Reviewed: Allergy & Precautions, H&P , NPO status , Patient's Chart, lab work & pertinent test results  History of Anesthesia Complications Negative for: history of anesthetic complications  Airway Mallampati: II  TM Distance: >3 FB     Dental   Pulmonary neg pulmonary ROS, former smoker,    Pulmonary exam normal        Cardiovascular negative cardio ROS   Rhythm:regular Rate:Normal     Neuro/Psych negative neurological ROS  negative psych ROS   GI/Hepatic negative GI ROS, Neg liver ROS,   Endo/Other  negative endocrine ROS  Renal/GU negative Renal ROS  negative genitourinary   Musculoskeletal   Abdominal   Peds  Hematology  (+) Blood dyscrasia, anemia ,   Anesthesia Other Findings   Reproductive/Obstetrics (+) Pregnancy                             Anesthesia Physical Anesthesia Plan  ASA: 2  Anesthesia Plan: Epidural   Post-op Pain Management:    Induction:   PONV Risk Score and Plan:   Airway Management Planned:   Additional Equipment:   Intra-op Plan:   Post-operative Plan:   Informed Consent: I have reviewed the patients History and Physical, chart, labs and discussed the procedure including the risks, benefits and alternatives for the proposed anesthesia with the patient or authorized representative who has indicated his/her understanding and acceptance.       Plan Discussed with:   Anesthesia Plan Comments:         Anesthesia Quick Evaluation  

## 2021-01-01 NOTE — Discharge Summary (Addendum)
Postpartum Discharge Summary  Date of Service updated: 01/03/2021     Patient Name: Mary Vincent DOB: February 11, 2001 MRN: [id number]  Date of admission: 12/31/2020 Delivery date:01/01/2021  Delivering provider: Serita Grammes D  Date of discharge: 01/03/2021  Admitting diagnosis: Labor without complication [D78] Intrauterine pregnancy: [redacted]w[redacted]d     Secondary diagnosis:  Active Problems:   Rubella non-immune status, antepartum   Group B Streptococcus carrier, +RV culture, currently pregnant   Labor without complication   Lab test positive for detection of COVID-19 virus  Additional problems: none    Discharge diagnosis: Term Pregnancy Delivered                                              Post partum procedures: none Augmentation:  none Complications: None  Hospital course: Onset of Labor With Vaginal Delivery      20 y.o. yo G1P0000 at [redacted]w[redacted]d was admitted in Latent Labor on 12/31/2020. Patient had an uncomplicated labor course, including receiving PCN x 2 doses for GBS ppx prior to delivery. Of note, shortly before delivery her admission Covid screening resulted as positive (asymptomatic other than rhinorrhea).  Membrane Rupture Time/Date: 2:28 AM ,01/01/2021   Delivery Method:Vaginal, Spontaneous  Episiotomy: None  Lacerations:  None  Patient had an uncomplicated postpartum course.  She is ambulating, tolerating a regular diet, passing flatus, and urinating well. Patient is discharged home in stable condition on 01/03/21.  Newborn Data: Birth date:01/01/2021  Birth time:7:41 AM  Gender:Female  Living status:Living  Apgars:8 ,9  Weight:2775 g   Magnesium Sulfate received: No BMZ received: No Rhophylac:N/A MMR:Yes- refused  T-DaP:Given prenatally Flu: No Transfusion:No  Physical exam  Vitals:   01/02/21 0703 01/02/21 2122 01/03/21 0200 01/03/21 0624  BP:  102/66 104/75 101/84  Pulse:  (!) 56 60 (!) 58  Resp:      Temp: 98.3 F (36.8 C) 97.8 F (36.6 C) 97.9 F (36.6  C) 97.7 F (36.5 C)  TempSrc: Oral Oral Oral Oral  SpO2:  100%  100%  Weight:      Height:       General: alert, cooperative, and no distress Lochia: appropriate Uterine Fundus: firm Incision: N/A DVT Evaluation: No evidence of DVT seen on physical exam. Labs: Lab Results  Component Value Date   WBC 6.4 01/01/2021   HGB 10.7 (L) 01/01/2021   HCT 33.0 (L) 01/01/2021   MCV 77.8 (L) 01/01/2021   PLT 148 (L) 01/01/2021   No flowsheet data found. Edinburgh Score: Edinburgh Postnatal Depression Scale Screening Tool 01/02/2021  I have been able to laugh and see the funny side of things. 0  I have looked forward with enjoyment to things. 0  I have blamed myself unnecessarily when things went wrong. 0  I have been anxious or worried for no good reason. 0  I have felt scared or panicky for no good reason. 0  Things have been getting on top of me. 0  I have been so unhappy that I have had difficulty sleeping. 0  I have felt sad or miserable. 0  I have been so unhappy that I have been crying. 0  The thought of harming myself has occurred to me. 0  Edinburgh Postnatal Depression Scale Total 0     After visit meds:  Allergies as of 01/03/2021   No Known Allergies  Medication List     STOP taking these medications    multivitamin-prenatal 27-0.8 MG Tabs tablet       TAKE these medications    acetaminophen 325 MG tablet Commonly known as: Tylenol Take 2 tablets (650 mg total) by mouth every 4 (four) hours as needed (for pain scale < 4).   benzocaine-Menthol 20-0.5 % Aero Commonly known as: DERMOPLAST Apply 1 application topically as needed for irritation (perineal discomfort).   ibuprofen 600 MG tablet Commonly known as: ADVIL Take 1 tablet (600 mg total) by mouth every 6 (six) hours.         Discharge home in stable condition Infant Feeding: Bottle Infant Disposition:home with mother Discharge instruction: per After Visit Summary and Postpartum  booklet. Activity: Advance as tolerated. Pelvic rest for 6 weeks.  Diet: routine diet Future Appointments: Future Appointments  Date Time Provider [place]  01/09/2021 10:35 AM Cyndee Brightly, CNM [facility]   Follow up Visit:  Myrtis Ser, CNM  P [facility] Pool Please schedule this patient for Postpartum visit in: 4 weeks with the following provider: Any provider  In-Person  For C/S patients schedule nurse incision check in weeks 2 weeks: no  Low risk pregnancy complicated by: none  Delivery mode:  SVD  Anticipated Birth Control:  other/unsure  PP Procedures needed: none  Schedule Integrated [place] visit: no    01/03/2021 Erskine Emery, MD

## 2021-01-01 NOTE — Lactation Note (Signed)
This note was copied from a baby's chart. Lactation Consultation Note  Patient Name: Mary Vincent [id number] Date: 01/01/2021 Reason for consult: L&D Initial assessment Age:22 hours  P1, Baby cueing.  Assisted with latching baby.  Baby comes off and on breast but is eager to relatch. During consult mother states she is very tired.  Passed information on to RN.  Lactation to follow up on MBU.  Maternal Data Does the patient have breastfeeding experience prior to this delivery?: No  Feeding Mother's Current Feeding Choice: Breast Milk and Formula  LATCH Score Latch: Repeated attempts needed to sustain latch, nipple held in mouth throughout feeding, stimulation needed to elicit sucking reflex.  Audible Swallowing: A few with stimulation  Type of Nipple: Everted at rest and after stimulation  Comfort (Breast/Nipple): Soft / non-tender  Hold (Positioning): Assistance needed to correctly position infant at breast and maintain latch.  LATCH Score: 7   Interventions Interventions: Assisted with latch;Skin to skin;Education     Consult Status Consult Status: Follow-up Date: 01/01/21 Follow-up type: In-patient    Dahlia Byes [facility] 01/01/2021, 9:03 AM

## 2021-01-01 NOTE — Lactation Note (Signed)
This note was copied from a baby's chart. Lactation Consultation Note  Patient Name: Mary Vincent Date: 01/01/2021 Reason for consult: Initial assessment;Mother's request;Primapara;1st time breastfeeding;Early term 37-38.6wks;Other (Comment) (Anemia) Age: 22 hrs  Infant not fed for 3 hrs. Infant very sleepy. LC did some suck training, infant has thick labial attachment, high palate and bunches up her tongue. With suck training and chin tug, worked on bringing her tongue down.   Mom inverted nipples. Right appears everted but collapses with pressure. Mom given breast shells to wear not pumping, sleeping or nursing. Mom also pre pump before latching 5-10 min.   LC set up Mom with DEBP q 3hrs for 15 min.   LC attempted latch at breast but infant not able to sustain it. LC used 20 NS infant latched for about 6 min then pops off.   Mom selected on admission breast and formula.  RN, Mary Vincent, to provide formula and review pace bottle feeding with slow flow nipple/ supplementation volume based on hrs of age.   Plan 1. To feed based on cues 8-12x in 24 hr period no more than 3 hrs without an attempt. Mom to offer both breasts and look for signs of milk transfer with help of NS. If unable to latch, Mom able to give EBM via spoon.  2. Mom to supplement after latching, breastfeeding supplementation guide provided with EBM first then formula. 3. Mom to pump with dEBP q 3 hrs for 15 min 4. [facility] brochure of inpatient and outpatient services reviewed.  5 I and O sheet reviewed.  All questions answered at the end of the visit.   Maternal Data Has patient been taught Hand Expression?: Yes Does the patient have breastfeeding experience prior to this delivery?: No  Feeding Mother's Current Feeding Choice: Breast Milk and Formula  LATCH Score Latch: Repeated attempts needed to sustain latch, nipple held in mouth throughout feeding, stimulation needed to elicit sucking reflex.  Audible  Swallowing: A few with stimulation  Type of Nipple: Inverted  Comfort (Breast/Nipple): Soft / non-tender  Hold (Positioning): Assistance needed to correctly position infant at breast and maintain latch.  LATCH Score: 5   Lactation Tools Discussed/Used Tools: Pump;Shells Breast pump type: Double-Electric Breast Pump Pump Education: Setup, frequency, and cleaning;Milk Storage Reason for Pumping: increase stimulation Pumping frequency: every 3 hrs for 15 min  Interventions Interventions: Breast feeding basics reviewed;Education;Support pillows;Assisted with latch;Position options;Skin to skin;Expressed milk;Breast massage;Hand express;DEBP;Breast compression;Adjust position  Discharge Pump: Manual WIC Program: Yes  Consult Status Consult Status: Follow-up Date: 01/02/21 Follow-up type: In-patient    Mary Vincent  Mary Vincent 01/01/2021, 3:10 PM

## 2021-01-01 NOTE — Anesthesia Postprocedure Evaluation (Signed)
Anesthesia Post Note  Patient: Art gallery manager  Procedure(s) Performed: AN AD HOC LABOR EPIDURAL     Patient location during evaluation: Mother Baby Anesthesia Type: Epidural Level of consciousness: awake and alert Pain management: pain level controlled Vital Signs Assessment: post-procedure vital signs reviewed and stable Respiratory status: spontaneous breathing, nonlabored ventilation and respiratory function stable Cardiovascular status: stable Postop Assessment: no headache, no backache, epidural receding, no apparent nausea or vomiting, patient able to bend at knees, adequate PO intake and able to ambulate Anesthetic complications: no   No notable events documented.  Last Vitals:  Vitals:   01/01/21 0941 01/01/21 1110  BP: 107/75 112/71  Pulse: 60   Resp: 16 16  Temp: 36.8 C 36.8 C  SpO2: 100% 100%    Last Pain:  Vitals:   01/01/21 1110  TempSrc: Oral  PainSc: 0-No pain   Pain Goal:                   [place]

## 2021-01-01 NOTE — Anesthesia Procedure Notes (Signed)
Epidural Patient location during procedure: OB Start time: 01/01/2021 1:59 AM End time: 01/01/2021 2:11 AM  Staffing Anesthesiologist: Lucretia Kern, MD Performed: anesthesiologist   Preanesthetic Checklist Completed: patient identified, IV checked, risks and benefits discussed, monitors and equipment checked, pre-op evaluation and timeout performed  Epidural Patient position: sitting Prep: DuraPrep Patient monitoring: heart rate, continuous pulse ox and blood pressure Approach: midline Location: L3-L4 Injection technique: LOR air  Needle:  Needle type: Tuohy  Needle gauge: 17 G Needle length: 9 cm Needle insertion depth: 5 cm Catheter type: closed end flexible Catheter size: 19 Gauge Catheter at skin depth: 10 cm Test dose: negative  Assessment Events: blood not aspirated, injection not painful, no injection resistance, no paresthesia and negative IV test  Additional Notes Reason for block:procedure for pain

## 2021-01-01 NOTE — Progress Notes (Signed)
Labor Progress Note Ellen Goris is a 20 y.o. G1P0000 at [redacted]w[redacted]d presented for labor S:  Feeling comfortable after epidural  O:  BP (!) 100/59   Pulse (!) 56   Temp 97.8 F (36.6 C) (Oral)   Resp 16   Ht $R'5\' 5"'dd$  (1.651 m)   Wt 66.3 kg   LMP 04/09/2020   SpO2 100%   BMI 24.31 kg/m  EFM: 130/moderate variability/+accels, intermittent moderate variables to 60  CVE: Dilation: Lip/rim Effacement (%): 100 Station: Plus 1, Plus 2 Presentation: Vertex Exam by:: Thornell Mule, MD   A&P: 20 y.o. G1P0000 [redacted]w[redacted]d admitted in labor #Labor: Progressing well. Anticipate vaginal delivery #Pain: Epidural in place #FWB: Cat 2 but reassuring with good variability #GBS positive, PCN running #COVID: Precautions in place, minor symptoms of rhinorrhea #anemia: Hgb 10.7 #Rubella NI: MMR pp  Waldon Merl, MD 5:27 AM

## 2021-01-01 NOTE — Progress Notes (Signed)
   PRENATAL VISIT NOTE  Subjective:  Mary Vincent is a 20 y.o. G1P1001 at [redacted]w[redacted]d being seen today for ongoing prenatal care.  She is currently monitored for the following issues for this low-risk pregnancy and has Oppositional defiant disorder; Supervision of normal first pregnancy, antepartum; Rubella non-immune status, antepartum; Vaginal bleeding in pregnancy, third trimester; Group B Streptococcus carrier, +RV culture, currently pregnant; Labor without complication; and Lab test positive for detection of COVID-19 virus on their problem list.  Patient reports contractions since last night, now 5-16min apart, not all consistently strong, often felt in her back. Presented to MAU earlier today but was sent home .  Contractions: Irritability. Vag. Bleeding: None.  Movement: Present. Denies leaking of fluid.   The following portions of the patient's history were reviewed and updated as appropriate: allergies, current medications, past family history, past medical history, past social history, past surgical history and problem list.   Objective:   Vitals:   12/31/20 1406  Weight: 146 lb 1.6 oz (66.3 kg)    Fetal Status: Fetal Heart Rate (bpm): 148 Fundal Height: 37 cm Movement: Present  Presentation: Vertex  General:  Alert, oriented and cooperative. Patient is in no acute distress.  Skin: Skin is warm and dry. No rash noted.   Cardiovascular: Normal heart rate noted  Respiratory: Normal respiratory effort, no problems with respiration noted  Abdomen: Soft, gravid, appropriate for gestational age.  Pain/Pressure: Present     Pelvic: Cervical exam deferred (Was 2-3 MAU earlier)  Extremities: Normal range of motion.  Edema: None  Mental Status: Normal mood and affect. Normal behavior. Normal judgment and thought content.   Assessment and Plan:  Pregnancy: G1P1001 at [redacted]w[redacted]d 1. Supervision of low-risk pregnancy, third trimester - Doing well, feeling regular and vigorous fetal movement -  Encouraged use of [employer] to either stop or improve contraction pattern since so many of her contractions are felt in her back and labor is not progressing. Pt engaged in learning and agrees to circuit when she gets home. Written instructions given in AVS.  2. [redacted] weeks gestation of pregnancy - Routine OB care - Encouraged pt to return to MAU if contractions consistently 3-39min apart, lasting 1 min.  Term labor symptoms and general obstetric precautions including but not limited to vaginal bleeding, contractions, leaking of fluid and fetal movement were reviewed in detail with the patient. Please refer to After Visit Summary for other counseling recommendations.   Return in about 1 week (around 01/07/2021) for IN-PERSON, LOB.  Future Appointments  Date Time Provider Department Center  01/09/2021 10:35 AM Amedeo Gory, CNM [facility]    Bernerd Limbo, CNM

## 2021-01-02 NOTE — Progress Notes (Signed)
Postpartum Day 1 Subjective: no complaints, up ad lib, voiding, tolerating PO, and + flatus. Expressed annoyance that pediatrics will not discharge baby until 48hrs due to GBS+ status despite adequate treatment. Doing well otherwise.  Objective: Blood pressure 101/72, pulse 63, temperature 98.3 F (36.8 C), temperature source Oral, resp. rate 16, height 5\' 5"  (1.651 m), weight 146 lb 1.6 oz (66.3 kg), last menstrual period 04/09/2020, SpO2 100 %, unknown if currently breastfeeding.  Physical Exam:  General: alert, cooperative, appears stated age, and no distress Lochia: appropriate Uterine Fundus: firm Incision: none DVT Evaluation: No evidence of DVT seen on physical exam.  Recent Labs    01/01/21 0041  HGB 10.7*  HCT 33.0*    Assessment/Plan: Plan for discharge tomorrow - reassurance given, pt expressed understanding.   LOS: 1 day   01/03/21 01/02/2021, 9:53 AM

## 2021-01-03 MED ORDER — BENZOCAINE-MENTHOL 20-0.5 % EX AERO: 1.0000 "application " | INHALATION_SPRAY | CUTANEOUS | Status: DC | PRN

## 2021-01-03 MED ORDER — ACETAMINOPHEN 325 MG PO TABS: 650.0000 mg | ORAL_TABLET | ORAL | Status: DC | PRN

## 2021-01-03 MED ORDER — IBUPROFEN 600 MG PO TABS
600.0000 mg | ORAL_TABLET | Freq: Four times a day (QID) | ORAL | 0 refills | Status: DC
Start: 2021-01-03 — End: 2021-05-18

## 2021-01-09 ENCOUNTER — Encounter: Payer: Medicaid Other | Admitting: Family

## 2021-01-15 ENCOUNTER — Telehealth (HOSPITAL_COMMUNITY): Payer: Self-pay

## 2021-01-15 NOTE — Telephone Encounter (Addendum)
No answer. Mailbox is full.   Mary Vincent Duster [facility] 01/15/2021,1458

## 2021-01-23 ENCOUNTER — Telehealth: Payer: Self-pay | Admitting: Family Medicine

## 2021-01-23 NOTE — Telephone Encounter (Signed)
Patient said she just delivered and she is having a very bad smell and discharge, she would like to speak to a nurse.

## 2021-01-23 NOTE — Telephone Encounter (Signed)
I called Mary Vincent and she confirms she isn't really bleeding anymore but spots sometimes. We discussed is normal to have bleeding that changes to a discharge then stops after delivery . She states her discharge is pinkish but has a really bad smell to it. States it doesn't smell like when she had BV. I offered her nurse visit for self swab 01/27/21 which she accepted.  Hadrian Yarbrough,RN

## 2021-01-27 ENCOUNTER — Other Ambulatory Visit (HOSPITAL_COMMUNITY)
Admission: RE | Admit: 2021-01-27 | Discharge: 2021-01-27 | Disposition: A | Discharge status: Home / Self Care | Payer: Medicaid Other | Source: Ambulatory Visit | Attending: Family Medicine | Admitting: Family Medicine

## 2021-01-27 ENCOUNTER — Other Ambulatory Visit: Payer: Self-pay

## 2021-01-27 ENCOUNTER — Ambulatory Visit (INDEPENDENT_AMBULATORY_CARE_PROVIDER_SITE_OTHER): Payer: Medicaid Other

## 2021-01-27 VITALS — BP 111/75 | HR 82 | Wt 141.5 lb

## 2021-01-27 DIAGNOSIS — N898 Other specified noninflammatory disorders of vagina: Secondary | ICD-10-CM

## 2021-01-27 NOTE — Progress Notes (Signed)
Here with complaint of vaginal odor. Pt discussed with Bonita Quin, RN over the phone who recommended pt come in for self-swab. Pt denies any discomfort or abnormal discharge. Pt would like to complete self swab today; instructions given and specimen obtained. Explained this odor may be related to postpartum bleeding, but we will check for all infection. Pt asks if there is anything she can do to regulate pH of vagina. I explained we recommend nothing in the vagina until postpartum appt. Recommended patient try a baking soda bath. Explained we can discuss additional options at postpartum appt if odor persists.  Fleet Contras RN 01/27/21

## 2021-01-28 LAB — CERVICOVAGINAL ANCILLARY ONLY
Bacterial Vaginitis (gardnerella): POSITIVE — AB
Candida Glabrata: NEGATIVE
Candida Vaginitis: NEGATIVE
Chlamydia: NEGATIVE
Comment: NEGATIVE
Comment: NEGATIVE
Comment: NEGATIVE
Comment: NEGATIVE
Comment: NEGATIVE
Comment: NORMAL
Neisseria Gonorrhea: NEGATIVE
Trichomonas: NEGATIVE

## 2021-01-28 NOTE — Progress Notes (Signed)
Patient was assessed and managed by nursing staff during this encounter. I have reviewed the chart and agree with the documentation and plan.   Edd Arbour, MSN, CNM, [facility] 01/28/21 3:56 PM

## 2021-02-03 ENCOUNTER — Other Ambulatory Visit: Payer: Self-pay | Admitting: Certified Nurse Midwife

## 2021-02-03 DIAGNOSIS — B9689 Other specified bacterial agents as the cause of diseases classified elsewhere: Secondary | ICD-10-CM

## 2021-02-03 MED ORDER — METRONIDAZOLE 0.75 % VA GEL: 1.0000 | Freq: Every day | VAGINAL | 1 refills | Status: DC

## 2021-02-16 ENCOUNTER — Ambulatory Visit (INDEPENDENT_AMBULATORY_CARE_PROVIDER_SITE_OTHER): Payer: Medicaid Other | Admitting: Nurse Practitioner

## 2021-02-16 ENCOUNTER — Other Ambulatory Visit (HOSPITAL_COMMUNITY)
Admission: RE | Admit: 2021-02-16 | Discharge: 2021-02-16 | Disposition: A | Discharge status: Home / Self Care | Payer: Medicaid Other | Source: Ambulatory Visit | Attending: Nurse Practitioner | Admitting: Nurse Practitioner

## 2021-02-16 ENCOUNTER — Other Ambulatory Visit: Payer: Self-pay

## 2021-02-16 ENCOUNTER — Encounter: Payer: Self-pay | Admitting: Nurse Practitioner

## 2021-02-16 DIAGNOSIS — Z30016 Encounter for initial prescription of transdermal patch hormonal contraceptive device: Secondary | ICD-10-CM

## 2021-02-16 DIAGNOSIS — N898 Other specified noninflammatory disorders of vagina: Secondary | ICD-10-CM

## 2021-02-16 MED ORDER — NORELGESTROMIN-ETH ESTRADIOL 150-35 MCG/24HR TD PTWK: 1.0000 | MEDICATED_PATCH | TRANSDERMAL | 12 refills | Status: DC

## 2021-02-16 NOTE — Progress Notes (Signed)
Post Partum Visit Note  Mary Vincent is a 20 y.o. G57P1001 female who presents for a postpartum visit. She is  11  weeks postpartum following a normal spontaneous vaginal delivery.  I have fully reviewed the prenatal and intrapartum course. The delivery was at [redacted]w[redacted]d gestational weeks.  Anesthesia: epidural. Postpartum course has been good. Baby is doing well. Baby is feeding by bottle - Gerber Gentle . Bleeding no bleeding. Bowel function is normal. Bladder function is normal. Patient is sexually active. Contraception method is none. Postpartum depression screening: negative.   The pregnancy intention screening data noted above was reviewed. Potential methods of contraception were discussed. The patient elected to proceed with Patch.  Discussed Liletta IUD and info given to client but she really wants to try the patch first.   Edinburgh Postnatal Depression Scale - 02/16/21 0828       Edinburgh Postnatal Depression Scale:  In the Past 7 Days   I have been able to laugh and see the funny side of things. 0    I have looked forward with enjoyment to things. 0    I have blamed myself unnecessarily when things went wrong. 0    I have been anxious or worried for no good reason. 0    I have felt scared or panicky for no good reason. 0    Things have been getting on top of me. 0    I have been so unhappy that I have had difficulty sleeping. 0    I have felt sad or miserable. 0    I have been so unhappy that I have been crying. 0    The thought of harming myself has occurred to me. 0    Edinburgh Postnatal Depression Scale Total 0             Health Maintenance Due  Topic Date Due   COVID-19 Vaccine (2 - Pfizer series) 05/20/2020   INFLUENZA VACCINE  Never done    The following portions of the patient's history were reviewed and updated as appropriate: allergies, current medications, past family history, past medical history, past social history, past surgical history, and problem  list.  Review of Systems Pertinent items noted in HPI and remainder of comprehensive ROS otherwise negative.  Objective:  BP 108/79   Pulse 88   Ht 5\' 5"  (1.651 m)   Wt 125 lb 14.4 oz (57.1 kg)   LMP 02/09/2021 (Exact Date)   Breastfeeding No   BMI 20.95 kg/m    General:  alert, cooperative, and no distress   Breasts:  not indicated  Lungs: clear to auscultation bilaterally  Heart:  regular rate and rhythm, S1, S2 normal, no murmur, click, rub or gallop  Abdomen: Soft nontender    Wound Declines pelvic exam  GU exam:  not indicated       Assessment:    Normal postpartum exam.   Plan:   Essential components of care per ACOG recommendations:  1.  Mood and well being: Patient with negative depression screening today. Reviewed local resources for support.  - Patient tobacco use? No.  - does not use tobacco but does smoke marijuana  - hx of drug use? No.  Other than marijuana  2. Infant care and feeding:  -Patient currently breastmilk feeding? No.  -Social determinants of health (SDOH) reviewed in EPIC. No concerns  3. Sexuality, contraception and birth spacing - Patient does not want a pregnancy in the next year.  Desired family size  is 3 children.  - Reviewed forms of contraception in tiered fashion. Patient desired Lucienne Minks today.   - Discussed birth spacing of 18 months  4. Sleep and fatigue -Encouraged family/partner/community support of 4 hrs of uninterrupted sleep to help with mood and fatigue  5. Physical Recovery  - Discussed patients delivery and complications. She describes her labor as good. - Patient had a Vaginal, no problems at delivery. Patient had a  no  laceration. Perineal healing reviewed. Patient expressed understanding - Patient has urinary incontinence? No. - Patient is not safe to resume physical and sexual activity until perineal soreness is gone and birth control is active in 2-4 weeks.  If having intercourse sooner, use condoms and  lubricant.  6.  Health Maintenance - HM due items addressed Yes - Last pap smear  Diagnosis  Date Value Ref Range Status  07/01/2020   Final   - Negative for intraepithelial lesion or malignancy (NILM)   Pap smear not done at today's visit.  -Breast Cancer screening indicated? No.   7. Chronic Disease/Pregnancy Condition follow up: None  - PCP follow up See AVS for additional info   Currie Paris, NP Center for [employer], [facility]

## 2021-02-17 LAB — CERVICOVAGINAL ANCILLARY ONLY
Bacterial Vaginitis (gardnerella): NEGATIVE
Candida Glabrata: NEGATIVE
Candida Vaginitis: NEGATIVE
Chlamydia: NEGATIVE
Comment: NEGATIVE
Comment: NEGATIVE
Comment: NEGATIVE
Comment: NEGATIVE
Comment: NEGATIVE
Comment: NORMAL
Neisseria Gonorrhea: NEGATIVE
Trichomonas: NEGATIVE

## 2021-03-18 ENCOUNTER — Other Ambulatory Visit: Payer: Self-pay

## 2021-05-18 ENCOUNTER — Other Ambulatory Visit: Payer: Self-pay

## 2021-05-18 ENCOUNTER — Telehealth (INDEPENDENT_AMBULATORY_CARE_PROVIDER_SITE_OTHER): Payer: Medicaid Other | Admitting: Pharmacist

## 2021-05-18 DIAGNOSIS — Z0130 Encounter for examination of blood pressure without abnormal findings: Secondary | ICD-10-CM

## 2021-05-18 NOTE — Progress Notes (Addendum)
Patient visit today conducted via MyChart virtual visit conducted via [employer]. Pharmacist located in office at [employer] [facility] and patient conducted visit from home. Verified patient identity with at least two patient identifiers. [name] was counseled on the limitations of a virtual visit and would like to proceed.  [name] is here today for a BP check after starting birth control (Ortho-Evra patch). She mentions that she has not been using her birth control patch and denies using any other form of birth control.  Patient was counseled that without birth control she could become pregnant and other birth control options that are available. She would not like to proceed with any birth control at this time.  Patient was unable to check blood pressure with device at-home. Patient will need to follow-up at yearly physical appointment unless otherwise needed.   Thank you for allowing pharmacy to be a part of your care today. Argentina Ponder, PharmD, BCPPS 05/18/2021 @ 13:05 pm

## 2021-05-29 ENCOUNTER — Telehealth: Payer: Medicaid Other | Admitting: Physician Assistant

## 2021-05-29 DIAGNOSIS — B9689 Other specified bacterial agents as the cause of diseases classified elsewhere: Secondary | ICD-10-CM

## 2021-05-29 DIAGNOSIS — N760 Acute vaginitis: Secondary | ICD-10-CM

## 2021-05-29 MED ORDER — METRONIDAZOLE 0.75 % VA GEL: 1.0000 | Freq: Two times a day (BID) | VAGINAL | 0 refills | Status: DC

## 2021-05-29 NOTE — Progress Notes (Signed)

## 2021-07-21 ENCOUNTER — Other Ambulatory Visit (HOSPITAL_COMMUNITY)
Admission: RE | Admit: 2021-07-21 | Discharge: 2021-07-21 | Disposition: A | Discharge status: Home / Self Care | Payer: Medicaid Other | Source: Ambulatory Visit | Attending: Family Medicine | Admitting: Family Medicine

## 2021-07-21 ENCOUNTER — Other Ambulatory Visit: Payer: Self-pay

## 2021-07-21 ENCOUNTER — Ambulatory Visit (INDEPENDENT_AMBULATORY_CARE_PROVIDER_SITE_OTHER): Payer: Medicaid Other

## 2021-07-21 VITALS — BP 103/68 | HR 55 | Wt 125.1 lb

## 2021-07-21 DIAGNOSIS — N898 Other specified noninflammatory disorders of vagina: Secondary | ICD-10-CM | POA: Insufficient documentation

## 2021-07-21 DIAGNOSIS — Z113 Encounter for screening for infections with a predominantly sexual mode of transmission: Secondary | ICD-10-CM

## 2021-07-21 NOTE — Progress Notes (Signed)
Patient here today with the complaint of vaginal irritation and heavier discharge. Patient requested to do a self swab and have STD blood testing done. I instructed patient on how to collect self swab. Self swab collected without issue. Explained to patient we will notify her with any abnormal results. Patient denies any other concerns or questions.   Alesia Richards, RN 07/21/21

## 2021-07-22 LAB — HEPATITIS C ANTIBODY: Hep C Virus Ab: NONREACTIVE

## 2021-07-22 LAB — HEPATITIS B SURFACE ANTIGEN: Hepatitis B Surface Ag: NEGATIVE

## 2021-07-22 LAB — RPR: RPR Ser Ql: NONREACTIVE

## 2021-07-22 LAB — HIV ANTIBODY (ROUTINE TESTING W REFLEX): HIV Screen 4th Generation wRfx: NONREACTIVE

## 2021-07-23 ENCOUNTER — Other Ambulatory Visit: Payer: Self-pay | Admitting: Nurse Practitioner

## 2021-07-23 DIAGNOSIS — B9689 Other specified bacterial agents as the cause of diseases classified elsewhere: Secondary | ICD-10-CM

## 2021-07-23 DIAGNOSIS — B379 Candidiasis, unspecified: Secondary | ICD-10-CM

## 2021-07-23 DIAGNOSIS — N760 Acute vaginitis: Secondary | ICD-10-CM

## 2021-07-23 LAB — CERVICOVAGINAL ANCILLARY ONLY
Bacterial Vaginitis (gardnerella): POSITIVE — AB
Candida Glabrata: NEGATIVE
Candida Vaginitis: POSITIVE — AB
Chlamydia: NEGATIVE
Comment: NEGATIVE
Comment: NEGATIVE
Comment: NEGATIVE
Comment: NEGATIVE
Comment: NEGATIVE
Comment: NORMAL
Neisseria Gonorrhea: NEGATIVE
Trichomonas: NEGATIVE

## 2021-07-23 MED ORDER — METRONIDAZOLE 500 MG PO TABS
500.0000 mg | ORAL_TABLET | Freq: Two times a day (BID) | ORAL | 0 refills | Status: DC
Start: 2021-07-23 — End: 2021-07-28

## 2021-07-23 MED ORDER — FLUCONAZOLE 150 MG PO TABS: ORAL_TABLET | ORAL | 0 refills | Status: DC

## 2021-07-26 ENCOUNTER — Other Ambulatory Visit: Payer: Self-pay | Admitting: Physician Assistant

## 2021-07-26 ENCOUNTER — Other Ambulatory Visit (INDEPENDENT_AMBULATORY_CARE_PROVIDER_SITE_OTHER): Payer: Self-pay | Admitting: Physician Assistant

## 2021-07-26 DIAGNOSIS — B9689 Other specified bacterial agents as the cause of diseases classified elsewhere: Secondary | ICD-10-CM

## 2021-07-26 DIAGNOSIS — N760 Acute vaginitis: Secondary | ICD-10-CM

## 2021-07-27 ENCOUNTER — Other Ambulatory Visit: Payer: Self-pay | Admitting: Physician Assistant

## 2021-07-27 DIAGNOSIS — N760 Acute vaginitis: Secondary | ICD-10-CM

## 2021-07-27 DIAGNOSIS — B9689 Other specified bacterial agents as the cause of diseases classified elsewhere: Secondary | ICD-10-CM

## 2021-07-28 MED ORDER — METRONIDAZOLE 0.75 % VA GEL: 1.0000 | Freq: Two times a day (BID) | VAGINAL | 0 refills | Status: DC

## 2021-10-09 ENCOUNTER — Ambulatory Visit (INDEPENDENT_AMBULATORY_CARE_PROVIDER_SITE_OTHER): Payer: Medicaid Other

## 2021-10-09 DIAGNOSIS — Z3201 Encounter for pregnancy test, result positive: Secondary | ICD-10-CM

## 2021-10-09 LAB — POCT PREGNANCY, URINE: Preg Test, Ur: POSITIVE — AB

## 2021-10-09 NOTE — Progress Notes (Signed)
Pt left urine for UPT resulting positive.  Pt denies VB and pain.  Medication/allergies reviewed.  Pt reports LMP is 08/27/21 which makes her EDD 06/03/22, and 6w 1d today.  Pt advised when to go to MAU.  Pt encouraged to start taking PNV and that someone from the front office will call her to schedule NEW OB intake/provider appt.  Pt verbalized understanding with no further questions.  ? ?Llana Deshazo,RN  ?10/09/21 ?

## 2021-10-21 ENCOUNTER — Ambulatory Visit: Payer: Medicaid Other
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
## 2021-12-03 ENCOUNTER — Other Ambulatory Visit (HOSPITAL_COMMUNITY)
Admission: RE | Admit: 2021-12-03 | Discharge: 2021-12-03 | Disposition: A | Discharge status: Home / Self Care | Payer: Medicaid Other | Source: Ambulatory Visit | Attending: Student | Admitting: Student

## 2021-12-03 ENCOUNTER — Other Ambulatory Visit: Payer: Self-pay

## 2021-12-03 ENCOUNTER — Ambulatory Visit (INDEPENDENT_AMBULATORY_CARE_PROVIDER_SITE_OTHER): Payer: Medicaid Other | Admitting: Student

## 2021-12-03 ENCOUNTER — Encounter: Payer: Self-pay | Admitting: Student

## 2021-12-03 VITALS — BP 106/63 | HR 78 | Wt 119.0 lb

## 2021-12-03 DIAGNOSIS — R102 Pelvic and perineal pain: Secondary | ICD-10-CM | POA: Diagnosis not present

## 2021-12-03 DIAGNOSIS — Z640 Problems related to unwanted pregnancy: Secondary | ICD-10-CM

## 2021-12-03 DIAGNOSIS — N898 Other specified noninflammatory disorders of vagina: Secondary | ICD-10-CM

## 2021-12-03 MED ORDER — ETONOGESTREL-ETHINYL ESTRADIOL 0.12-0.015 MG/24HR VA RING: VAGINAL_RING | VAGINAL | 12 refills | Status: DC

## 2021-12-03 MED ORDER — NORELGESTROMIN-ETH ESTRADIOL 150-35 MCG/24HR TD PTWK: 1.0000 | MEDICATED_PATCH | TRANSDERMAL | 12 refills | Status: DC

## 2021-12-03 NOTE — Patient Instructions (Addendum)
--  increase water intake -increase your vegetables, decrease fast food -decrease other sugars like bread, rice -increase Miralax

## 2021-12-03 NOTE — Progress Notes (Signed)
Pelvic ultrasound has been scheduled for Monday July 10, 20 @10  am. [facility]  Information was given to patient along with instructions to come to appointment with full bladder and she verbalized understanding.   There were no further questions or concerns.   Advertising copywriter, CMA   12/03/21  5:17

## 2021-12-03 NOTE — Progress Notes (Signed)
Patient informed me that she was recently pregnant but decided to terminate the pregnancy. She went to [facility] and was prescribed a "pill" June 6.   She complains of "sore body". She describes it as pelvic cramps and being bloated. She believe she is constipated due to her not fully having a bowel movement and "straining a lot"   Mary Vincent also would like to screen for all infections due to her having "white" vaginal discharge. Denies any itch or odor.

## 2021-12-04 DIAGNOSIS — Z640 Problems related to unwanted pregnancy: Secondary | ICD-10-CM | POA: Insufficient documentation

## 2021-12-07 LAB — CERVICOVAGINAL ANCILLARY ONLY
Bacterial Vaginitis (gardnerella): POSITIVE — AB
Candida Glabrata: NEGATIVE
Candida Vaginitis: NEGATIVE
Chlamydia: NEGATIVE
Comment: NEGATIVE
Comment: NEGATIVE
Comment: NEGATIVE
Comment: NEGATIVE
Comment: NEGATIVE
Comment: NORMAL
Neisseria Gonorrhea: NEGATIVE
Trichomonas: NEGATIVE

## 2021-12-14 ENCOUNTER — Other Ambulatory Visit: Payer: Self-pay | Admitting: *Deleted

## 2021-12-14 ENCOUNTER — Inpatient Hospital Stay: Admission: RE | Admit: 2021-12-14 | Payer: Medicaid Other | Source: Ambulatory Visit

## 2021-12-14 ENCOUNTER — Encounter: Payer: Self-pay | Admitting: *Deleted

## 2021-12-14 DIAGNOSIS — N760 Acute vaginitis: Secondary | ICD-10-CM

## 2021-12-14 MED ORDER — METRONIDAZOLE 500 MG PO TABS: 500.0000 mg | ORAL_TABLET | Freq: Two times a day (BID) | ORAL | 0 refills | Status: DC

## 2022-06-07 NOTE — L&D Delivery Note (Signed)
OB/GYN Faculty Practice Delivery Note  Benni Legrow is a 22 y.o. G3P1011 s/p SVD at [redacted]w[redacted]d. She was admitted for eIOL.   ROM: 10h 22m with clear fluid GBS Status:  Negative/-- (09/05 0808) Maximum Maternal Temperature: 98.55F  Labor Progress: Initial SVE: 2/50/-2 posterior. She was augmented with pitocin. SROM clear fluid, with recurrent deep variables and borderline tachy systole necessitating pitocin to be stopped briefly. She then progressed to complete.   Delivery Date/Time: 03/01/23 @1409  Delivery: Called to room and patient was complete and pushing. Head delivered LOA. Tight nuchal cord present, slowly delivered through. Shoulder and body delivered in usual fashion. Infant with spontaneous cry, placed on mother's abdomen, dried and stimulated. Cord clamped x 2 after 1-minute delay, and cut by FOB. Cord blood drawn. Placenta delivered spontaneously with gentle cord traction. Fundus firm with massage and Pitocin. Labia, perineum, vagina, and cervix inspected without lesion.  Baby Weight: pending  Placenta: 3 vessel, intact. Sent to L&D Complications: None Lacerations: none EBL: 112 mL Analgesia: Epidural   Infant:  APGAR (1 MIN):  7 APGAR (5 MINS):  9  Wyn Forster, MD OB Family Medicine Fellow, [facility] for [facility], [facility] 03/01/2023, 2:21 PM

## 2022-08-03 ENCOUNTER — Ambulatory Visit: Payer: Medicaid Other

## 2022-10-09 IMAGING — US US OB < 14 WEEKS - US OB TV
1 series · 15 of 28 positions shown · non-contrast
Comparison: None.

CLINICAL DATA: Initial evaluation for acute pelvic pain, early
pregnancy.

EXAM:
OBSTETRIC <14 WK US AND TRANSVAGINAL OB US
TECHNIQUE: Both transabdominal and transvaginal ultrasound examinations were
performed for complete evaluation of the gestation as well as the
maternal uterus, adnexal regions, and pelvic cul-de-sac.
Transvaginal technique was performed to assess early pregnancy.

[Series 1: us ob < 14 weeks - us ob tv · 15 of 41 slices shown]
[im 1/41]
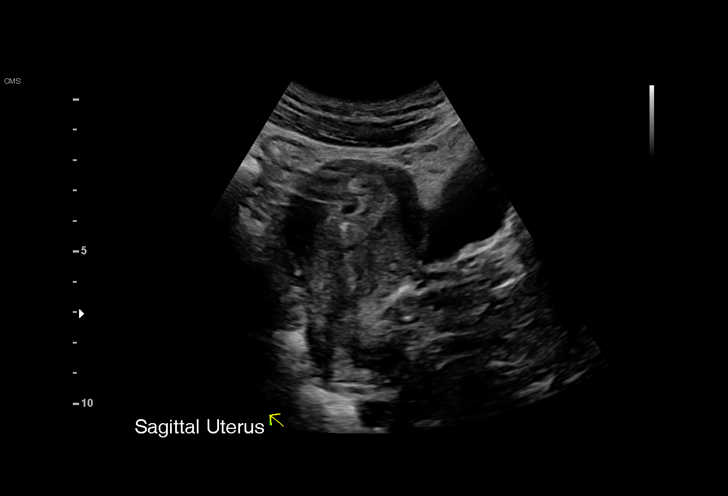
[im 3/41]
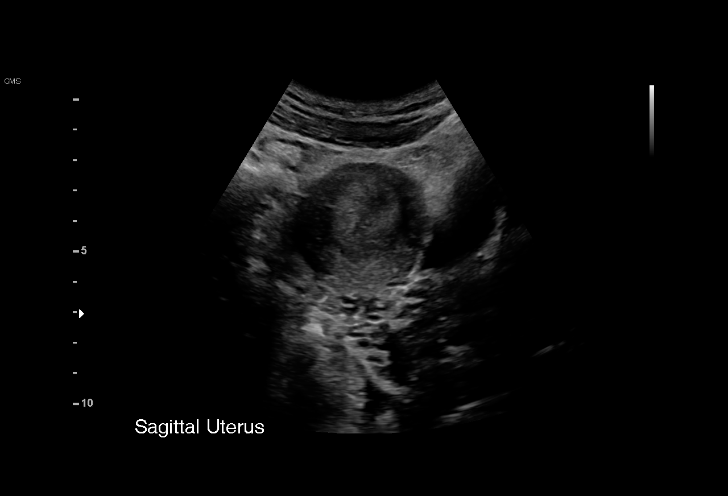
[im 6/41]
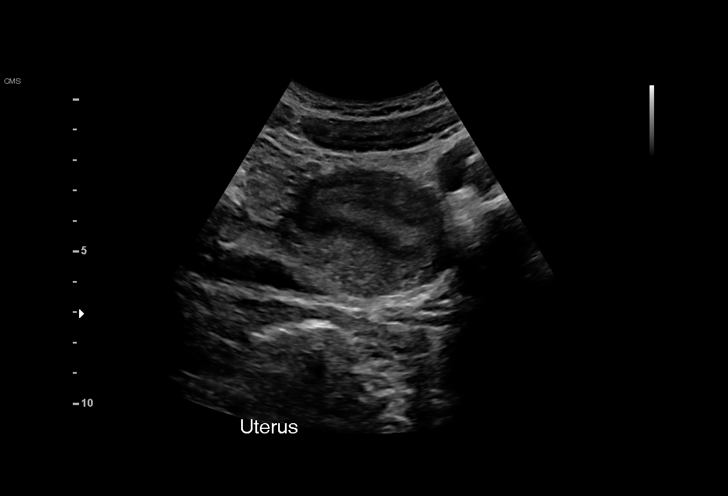
[im 9/41]
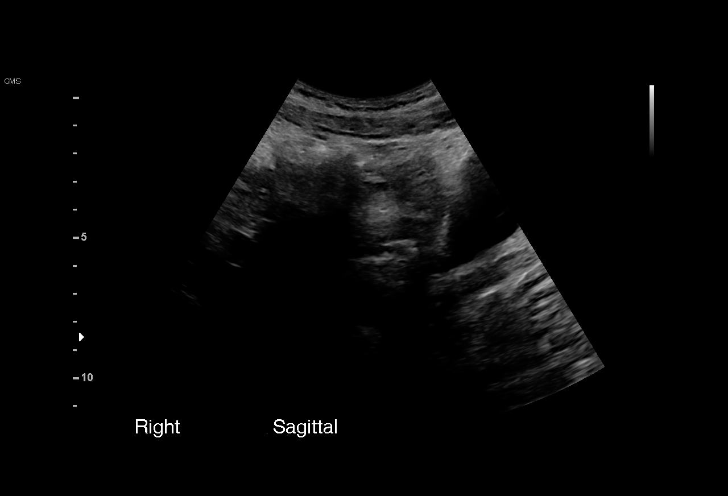
[im 12/41]
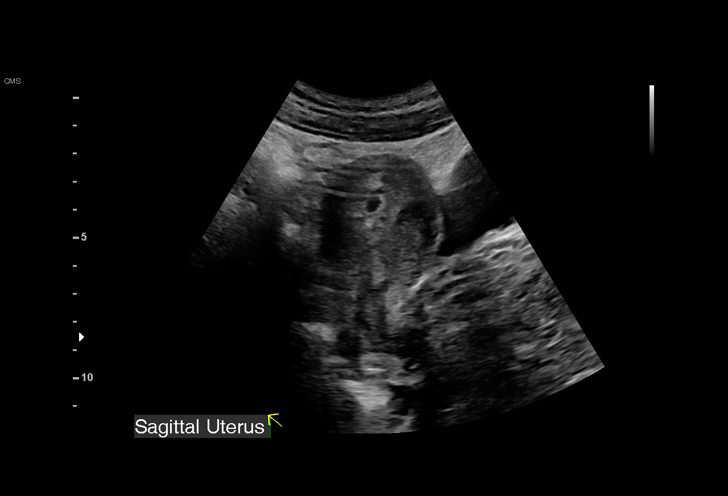
[im 15/41]
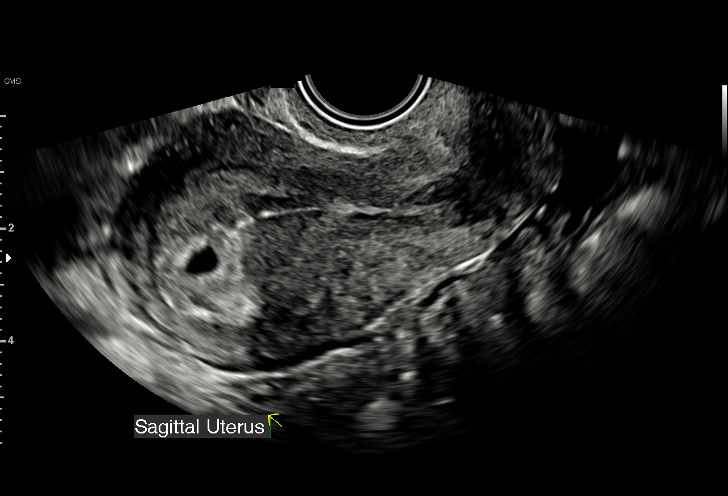
[im 18/41]
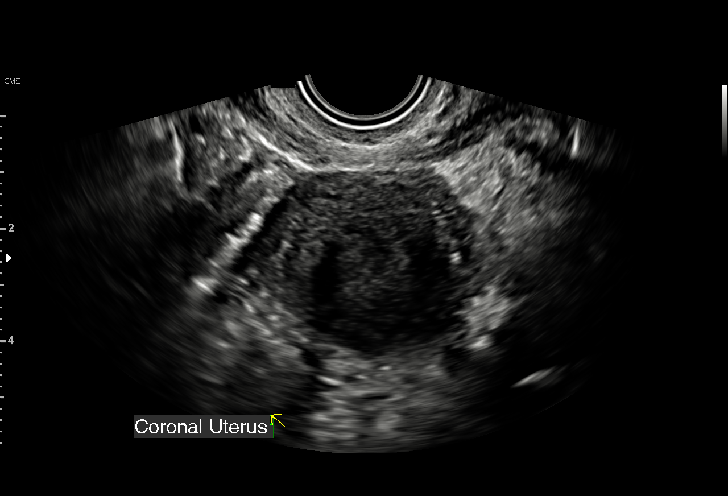
[im 21/41]
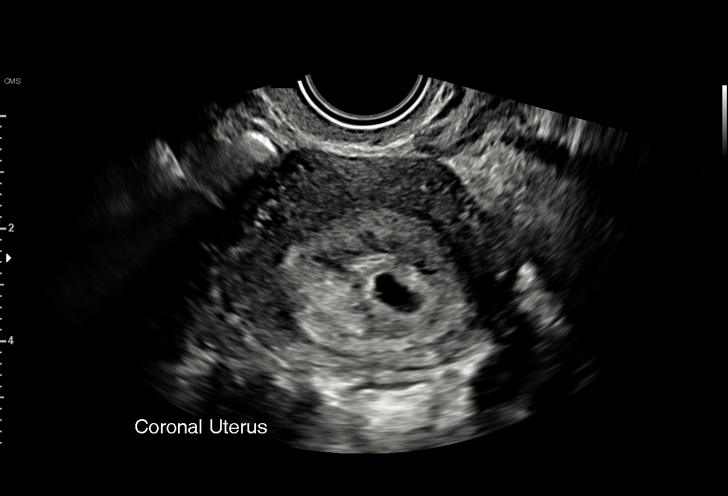
[im 23/41]
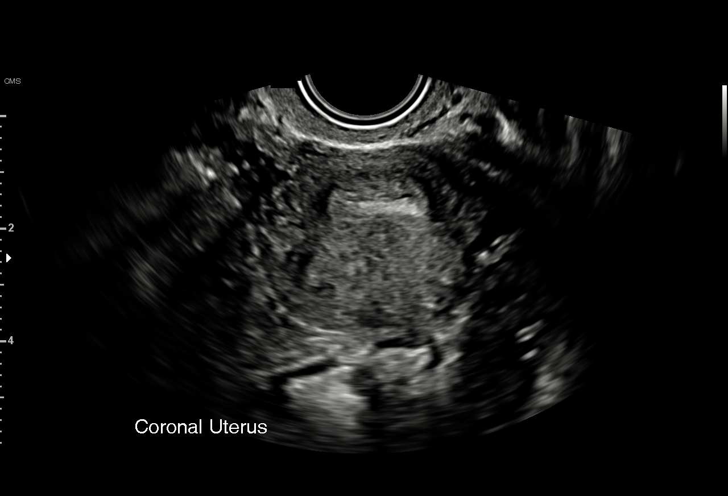
[im 26/41]
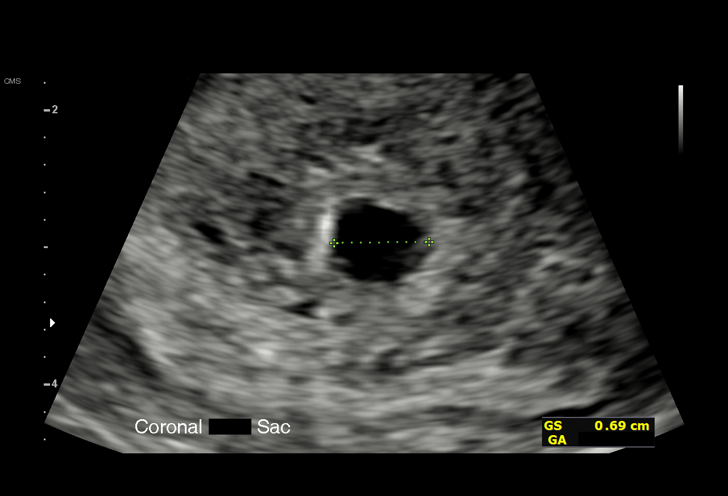
[im 29/41]
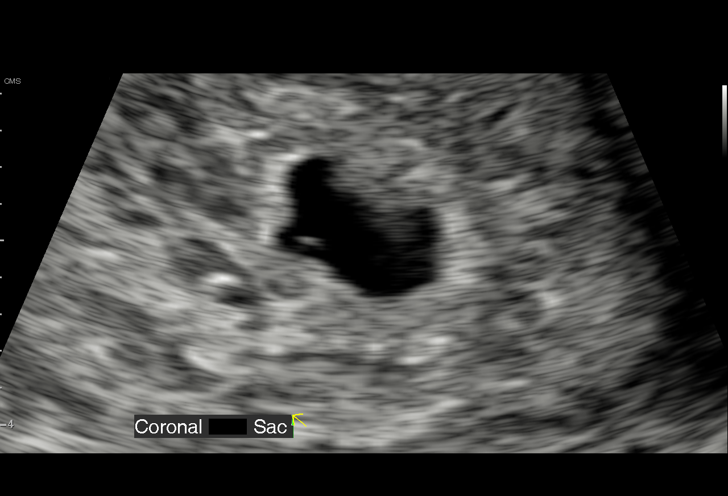
[im 32/41]
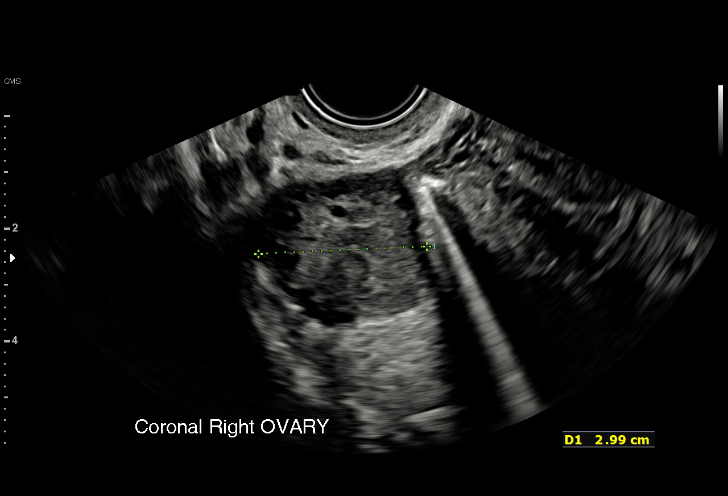
[im 35/41]
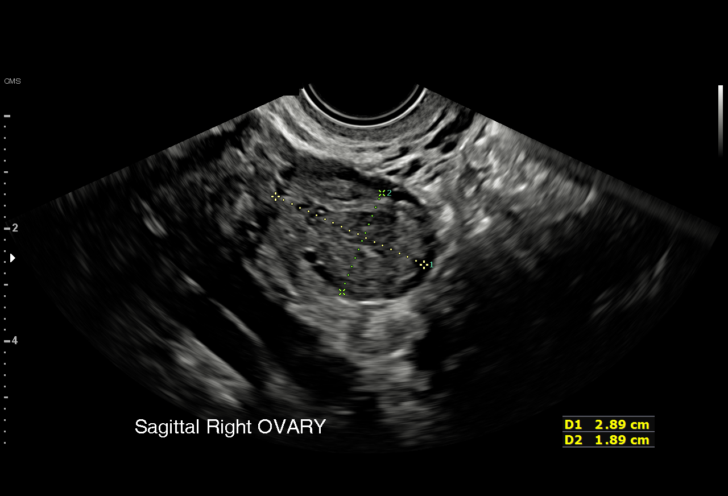
[im 38/41]
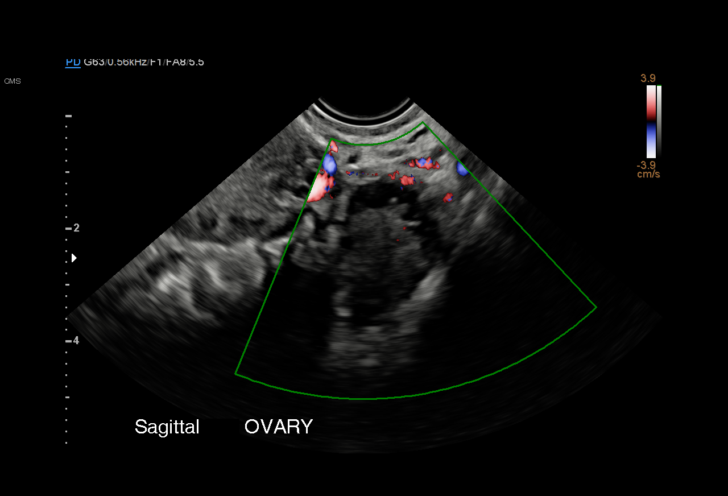
[im 41/41]
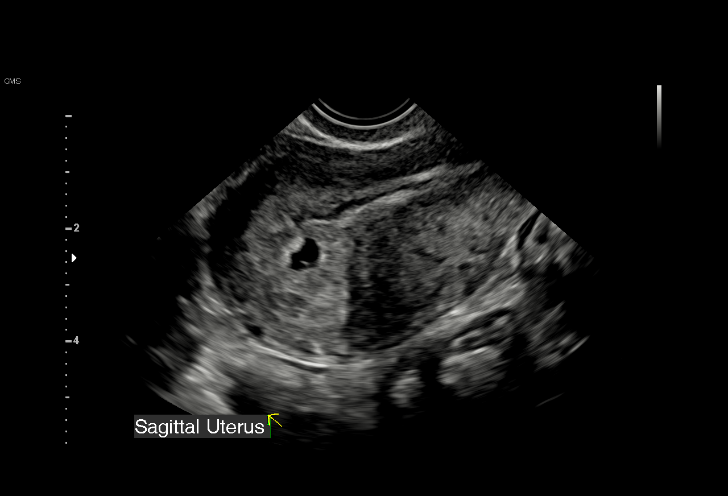

[15 of 28 positions shown; findings below may reference images not displayed]

FINDINGS: Intrauterine gestational sac: Single

Yolk sac:  Present

Embryo:  Negative.

Cardiac Activity: Negative.

Heart Rate: N/A

MSD: 6.0 mm   5 w   2 d

Subchorionic hemorrhage:  None visualized.

Maternal uterus/adnexae: Ovaries within normal limits. No adnexal
mass. Trace free fluid noted within the pelvic cul-de-sac.
IMPRESSION: 1. Early intrauterine gestational sac with internal yolk sac, but no
fetal pole or cardiac activity yet visualized. Recommend follow-up
quantitative B-HCG levels and follow-up US in 14 days to confirm and
assess viability.
2. No other acute maternal uterine or adnexal abnormality
identified.

## 2022-10-10 ENCOUNTER — Encounter (HOSPITAL_COMMUNITY): Payer: Self-pay

## 2022-10-10 ENCOUNTER — Inpatient Hospital Stay (HOSPITAL_COMMUNITY)
Admission: AD | Admit: 2022-10-10 | Discharge: 2022-10-10 | Disposition: A | Discharge status: Home / Self Care | Payer: Medicaid Other | Attending: Obstetrics and Gynecology | Admitting: Obstetrics and Gynecology

## 2022-10-10 DIAGNOSIS — R109 Unspecified abdominal pain: Secondary | ICD-10-CM | POA: Diagnosis not present

## 2022-10-10 DIAGNOSIS — O26892 Other specified pregnancy related conditions, second trimester: Secondary | ICD-10-CM | POA: Diagnosis not present

## 2022-10-10 DIAGNOSIS — Z3A19 19 weeks gestation of pregnancy: Secondary | ICD-10-CM

## 2022-10-10 DIAGNOSIS — B9689 Other specified bacterial agents as the cause of diseases classified elsewhere: Secondary | ICD-10-CM | POA: Insufficient documentation

## 2022-10-10 DIAGNOSIS — N898 Other specified noninflammatory disorders of vagina: Secondary | ICD-10-CM

## 2022-10-10 DIAGNOSIS — O23592 Infection of other part of genital tract in pregnancy, second trimester: Secondary | ICD-10-CM | POA: Insufficient documentation

## 2022-10-10 LAB — WET PREP, GENITAL
Sperm: NONE SEEN
Trich, Wet Prep: NONE SEEN
WBC, Wet Prep HPF POC: >=10 — AB (ref <10)
Yeast Wet Prep HPF POC: NONE SEEN

## 2022-10-10 LAB — URINALYSIS, ROUTINE W REFLEX MICROSCOPIC
Bilirubin Urine: NEGATIVE
Glucose, UA: NEGATIVE mg/dL
Hgb urine dipstick: NEGATIVE
Ketones, ur: NEGATIVE mg/dL
Nitrite: NEGATIVE
Protein, ur: NEGATIVE mg/dL
Specific Gravity, Urine: 1.021 (ref 1.005–1.030)
pH: 7 (ref 5.0–8.0)

## 2022-10-10 MED ORDER — METRONIDAZOLE 0.75 % VA GEL: 1.0000 | Freq: Every day | VAGINAL | 1 refills | Status: DC

## 2022-10-10 NOTE — Progress Notes (Signed)
Patient requesting discharge and to receive results through myChart.
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
## 2022-10-10 NOTE — Discharge Instructions (Signed)

## 2022-10-11 LAB — GC/CHLAMYDIA PROBE AMP (~~LOC~~) NOT AT ARMC
Chlamydia: NEGATIVE
Comment: NEGATIVE
Comment: NORMAL
Neisseria Gonorrhea: NEGATIVE

## 2022-10-20 ENCOUNTER — Telehealth (INDEPENDENT_AMBULATORY_CARE_PROVIDER_SITE_OTHER): Payer: Medicaid Other

## 2022-10-20 DIAGNOSIS — Z3689 Encounter for other specified antenatal screening: Secondary | ICD-10-CM

## 2022-10-20 DIAGNOSIS — Z3402 Encounter for supervision of normal first pregnancy, second trimester: Secondary | ICD-10-CM

## 2022-10-20 DIAGNOSIS — Z3482 Encounter for supervision of other normal pregnancy, second trimester: Secondary | ICD-10-CM

## 2022-10-20 DIAGNOSIS — Z3490 Encounter for supervision of normal pregnancy, unspecified, unspecified trimester: Secondary | ICD-10-CM | POA: Insufficient documentation

## 2022-10-20 DIAGNOSIS — Z3A19 19 weeks gestation of pregnancy: Secondary | ICD-10-CM

## 2022-10-20 DIAGNOSIS — Z3400 Encounter for supervision of normal first pregnancy, unspecified trimester: Secondary | ICD-10-CM | POA: Insufficient documentation

## 2022-10-20 MED ORDER — BLOOD PRESSURE KIT DEVI: 1.0000 | 0 refills | Status: DC | PRN

## 2022-10-20 NOTE — Patient Instructions (Addendum)
Safe Medications in Pregnancy   Acne:  Benzoyl Peroxide  Salicylic Acid   Backache/Headache:  Tylenol: 2 regular strength every 4 hours OR               2 Extra strength every 6 hours   Colds/Coughs/Allergies:  Benadryl (alcohol free) 25 mg every 6 hours as needed  Breath right strips  Claritin  Cepacol throat lozenges  Chloraseptic throat spray  Cold-Eeze- up to three times per day  Cough drops, alcohol free  Flonase (by prescription only)  Guaifenesin  Mucinex  Robitussin DM (plain only, alcohol free)  Saline nasal spray/drops  Sudafed (pseudoephedrine) & Actifed * use only after [redacted] weeks gestation and if you do not have high blood pressure  Tylenol  Vicks Vaporub  Zinc lozenges  Zyrtec   Constipation:  Colace  Ducolax suppositories  Fleet enema  Glycerin suppositories  Metamucil  Milk of magnesia  Miralax  Senokot  Smooth move tea   Diarrhea:  Kaopectate  Imodium A-D   *NO pepto Bismol   Hemorrhoids:  Anusol  Anusol HC  Preparation H  Tucks   Indigestion:  Tums  Maalox  Mylanta  Zantac  Pepcid   Insomnia:  Benadryl (alcohol free) 25mg every 6 hours as needed  Tylenol PM  Unisom, no Gelcaps   Leg Cramps:  Tums  MagGel   Nausea/Vomiting:  Bonine  Dramamine  Emetrol  Ginger extract  Sea bands  Meclizine  Nausea medication to take during pregnancy:  Unisom (doxylamine succinate 25 mg tablets) Take one tablet daily at bedtime. If symptoms are not adequately controlled, the dose can be increased to a maximum recommended dose of two tablets daily (1/2 tablet in the morning, 1/2 tablet mid-afternoon and one at bedtime).  Vitamin B6 100mg tablets. Take one tablet twice a day (up to 200 mg per day).   Skin Rashes:  Aveeno products  Benadryl cream or 25mg every 6 hours as needed  Calamine Lotion  1% cortisone cream   Yeast infection:  Gyne-lotrimin 7  Monistat 7    **If taking multiple medications, please check labels to avoid  duplicating the same active ingredients  **take medication as directed on the label  ** Do not exceed 4000 mg of tylenol in 24 hours  **Do not take medications that contain aspirin or ibuprofen         Considering Waterbirth? Guide for patients at [facility] ([facility]) Why consider waterbirth? Gentle birth for babies  Less pain medicine used in labor  May allow for passive descent/less pushing  May reduce perineal tears  More mobility and instinctive maternal position changes  Increased maternal relaxation   Is waterbirth safe? What are the risks of infection, drowning or other complications? Infection:  Very low risk (3.7 % for tub vs 4.8% for bed)  7 in 8000 waterbirths with documented infection  Poorly cleaned equipment most common cause  Slightly lower group B strep transmission rate  Drowning  Maternal:  Very low risk  Related to seizures or fainting  Newborn:  Very low risk. No evidence of increased risk of respiratory problems in multiple large studies  Physiological protection from breathing under water  Avoid underwater birth if there are any fetal complications  Once baby's head is out of the water, keep it out.  Birth complication  Some reports of cord trauma, but risk decreased by bringing baby to surface gradually  No evidence of increased risk of shoulder dystocia. Mothers can usually change   positions faster in water than in a bed, possibly aiding the maneuvers to free the shoulder.   There are 2 things you MUST do to have a waterbirth with [facility]: Attend a waterbirth class at [facility] [place]   3rd Wednesday of every month from 7-9 pm (virtual during COVID) Free Register online at [email or web address] or [email or web address] or by calling [phone] Bring us the certificate from the class to your prenatal appointment or send via MyChart Meet with a midwife at 36 weeks* to see if you can still plan a waterbirth and  to sign the consent.   *We also recommend that you schedule as many of your prenatal visits with a midwife as possible.    Helpful information: You may want to bring a bathing suit top to the hospital to wear during labor but this is optional.  All other supplies are provided by the hospital. Please arrive at the hospital with signs of active labor, and do not wait at home until late in labor. It takes 45 min- 1 hour for fetal monitoring, and check in to your room to take place, plus transport and filling of the waterbirth tub.    Things that would prevent you from having a waterbirth: Premature, <37wks  Previous cesarean birth  Presence of thick meconium-stained fluid  Multiple gestation (Twins, triplets, etc.)  Uncontrolled diabetes or gestational diabetes requiring medication  Hypertension diagnosed in pregnancy or preexisting hypertension (gestational hypertension, preeclampsia, or chronic hypertension) Fetal growth restriction (your baby measures less than 10th percentile on ultrasound) Heavy vaginal bleeding  Non-reassuring fetal heart rate  Active infection (MRSA, etc.). Group B Strep is NOT a contraindication for waterbirth.  If your labor has to be induced and induction method requires continuous monitoring of the baby's heart rate  Other risks/issues identified by your obstetrical provider   Please remember that birth is unpredictable. Under certain unforeseeable circumstances your provider may advise against giving birth in the tub. These decisions will be made on a case-by-case basis and with the safety of you and your baby as our highest priority.    Updated 09/09/21     

## 2022-10-20 NOTE — Progress Notes (Signed)
New OB Intake  I connected with Mary Vincent  on 10/20/22 at  3:15 PM EDT by telephone and verified that I am speaking with the correct person using two identifiers. Nurse is located at [facility] and pt is located at home.  I discussed the limitations, risks, security and privacy concerns of performing an evaluation and management service by telephone and the availability of in person appointments. I also discussed with the patient that there may be a patient responsible charge related to this service. The patient expressed understanding and agreed to proceed.  I explained I am completing New OB Intake today. We discussed EDD of 03/05/2023 that is based on LMP of 05/29/2022. Pt is G3/P1. I reviewed her allergies, medications, Medical/Surgical/OB history, and appropriate screenings. I informed her of [facility] services. [facility] information placed in AVS. Based on history, this is a low risk pregnancy.  Patient Active Problem List   Diagnosis Date Noted   Unwanted pregnancy 12/04/2021   History of COVID-19 01/01/2021    Concerns addressed today  Delivery Plans Plans to deliver at [facility]. Patient given information for [facility] Healthy [facility] website for more information about [facility]. Patient is interested in water birth. Offered upcoming OB visit with CNM to discuss further.  MyChart/Babyscripts MyChart access verified. I explained pt will have some visits in office and some virtually. Babyscripts instructions given and order placed. Patient verifies receipt of registration text/e-mail. Account successfully created and app downloaded.  Blood Pressure Cuff/Weight Scale Blood pressure cuff ordered for patient to pick-up from [employer]. Explained after first prenatal appt pt will check weekly and document in Babyscripts.  Anatomy US Explained first scheduled [place] will be around 19 weeks. Anatomy US scheduled for at 11/25/2022. Pt notified to arrive at 8:15am.  Labs Discussed  Avelina Laine genetic screening with patient. Would like both Panorama and Horizon drawn at new OB visit. Routine prenatal labs needed.  COVID Vaccine Patient has had COVID vaccine.   Is patient a CenteringPregnancy candidate?  Declined Declined due to Declined to say    Is patient a Mom+Baby Combined Care candidate?  Not a candidate   If accepted, Mom+Baby staff notified  Social Determinants of Health Food Insecurity: Patient denies food insecurity. WIC Referral: Patient is interested in referral to [facility].  Transportation: Patient denies transportation needs. Childcare: Discussed no children allowed at ultrasound appointments. Offered childcare services; patient declines childcare services at this time.  Interested in [place]? If yes, send referral and doula dot phrase.   First visit review I reviewed new OB appt with patient. I explained they will have a provider visit that includes new ob labs and listening to baby heartbeat. Explained pt will be seen by Edd Arbour, CNM at first visit; encounter routed to appropriate provider. Explained that patient will be seen by pregnancy navigator following visit with provider.   Lowry Bowl, CMA 10/20/2022  2:39 PM

## 2022-10-27 ENCOUNTER — Ambulatory Visit (INDEPENDENT_AMBULATORY_CARE_PROVIDER_SITE_OTHER): Payer: Medicaid Other | Admitting: Certified Nurse Midwife

## 2022-10-27 ENCOUNTER — Other Ambulatory Visit: Payer: Self-pay

## 2022-10-27 VITALS — BP 101/70 | HR 76 | Ht 65.0 in | Wt 133.1 lb

## 2022-10-27 DIAGNOSIS — D563 Thalassemia minor: Secondary | ICD-10-CM

## 2022-10-27 DIAGNOSIS — Z3A21 21 weeks gestation of pregnancy: Secondary | ICD-10-CM

## 2022-10-27 DIAGNOSIS — N760 Acute vaginitis: Secondary | ICD-10-CM

## 2022-10-27 DIAGNOSIS — Z3402 Encounter for supervision of normal first pregnancy, second trimester: Secondary | ICD-10-CM

## 2022-10-27 DIAGNOSIS — D508 Other iron deficiency anemias: Secondary | ICD-10-CM

## 2022-10-27 DIAGNOSIS — O0932 Supervision of pregnancy with insufficient antenatal care, second trimester: Secondary | ICD-10-CM

## 2022-10-27 MED ORDER — VITAFOL GUMMIES 3.33-0.333-34.8 MG PO CHEW
3.0000 | CHEWABLE_TABLET | Freq: Every day | ORAL | 11 refills | Status: DC
Start: 2022-10-27 — End: 2023-09-13

## 2022-10-27 NOTE — Patient Instructions (Addendum)
[facility] [address] [place], [place] [address] [phone] Hours: Sunday Closed Monday 9AM-6PM Tuesday 9AM-6PM Wednesday 9AM-6PM Thursday 9AM-6PM Friday           9AM-6PM Saturday         10 AM-1PM  [facility]  Tuesdays & Thursdays 9:30am-3:30pm [address], [place], [place] Located in the [employer] Building ([facility]) Room 110A  For more information: Call: [phone] or Email: [email or web address] To book a FREE appt: [email or web address]  Offering prenatal consults,  postpartum outpatient lactation consults,  pumping consults and  "Each One, Teach One" informal lactation education for support persons.

## 2022-10-28 ENCOUNTER — Encounter: Payer: Self-pay | Admitting: *Deleted

## 2022-10-28 DIAGNOSIS — D563 Thalassemia minor: Secondary | ICD-10-CM | POA: Insufficient documentation

## 2022-10-28 LAB — CBC/D/PLT+RPR+RH+ABO+RUBIGG...
Antibody Screen: NEGATIVE
Basophils Absolute: 0 10*3/uL (ref 0.0–0.2)
Basos: 1 %
EOS (ABSOLUTE): 0.1 10*3/uL (ref 0.0–0.4)
Eos: 1 %
HCV Ab: NONREACTIVE
HIV Screen 4th Generation wRfx: NONREACTIVE
Hematocrit: 30.4 % — ABNORMAL LOW (ref 34.0–46.6)
Hemoglobin: 9.5 g/dL — ABNORMAL LOW (ref 11.1–15.9)
Hepatitis B Surface Ag: NEGATIVE
Immature Grans (Abs): 0 10*3/uL (ref 0.0–0.1)
Immature Granulocytes: 0 %
Lymphocytes Absolute: 1 10*3/uL (ref 0.7–3.1)
Lymphs: 17 %
MCH: 21.9 pg — ABNORMAL LOW (ref 26.6–33.0)
MCHC: 31.3 g/dL — ABNORMAL LOW (ref 31.5–35.7)
MCV: 70 fL — ABNORMAL LOW (ref 79–97)
Monocytes Absolute: 0.5 10*3/uL (ref 0.1–0.9)
Monocytes: 8 %
Neutrophils Absolute: 4.3 10*3/uL (ref 1.4–7.0)
Neutrophils: 73 %
Platelets: 219 10*3/uL (ref 150–450)
RBC: 4.33 x10E6/uL (ref 3.77–5.28)
RDW: 18.5 % — ABNORMAL HIGH (ref 11.7–15.4)
RPR Ser Ql: NONREACTIVE
Rh Factor: POSITIVE
Rubella Antibodies, IGG: <0.9 index — ABNORMAL LOW (ref >0.99)
WBC: 5.8 10*3/uL (ref 3.4–10.8)

## 2022-10-28 LAB — HCV INTERPRETATION

## 2022-10-28 LAB — HEMOGLOBIN A1C
Est. average glucose Bld gHb Est-mCnc: 103 mg/dL
Hgb A1c MFr Bld: 5.2 % (ref 4.8–5.6)

## 2022-10-28 MED ORDER — ASPIRIN 81 MG PO TBEC
81.0000 mg | DELAYED_RELEASE_TABLET | Freq: Every day | ORAL | 12 refills | Status: DC
Start: 2022-10-28 — End: 2023-03-02

## 2022-10-28 MED ORDER — NIFEREX PO TABS
1.0000 | ORAL_TABLET | Freq: Every day | ORAL | 9 refills | Status: DC
Start: 2022-10-28 — End: 2023-01-12

## 2022-10-28 NOTE — Progress Notes (Signed)
History:   Thaiz Trost is a 22 y.o. G3P1011 at [redacted]w[redacted]d by LMP being seen today for her first obstetrical visit.  Her obstetrical history is significant for  two prior pregnancies, one uncomplicated pregnancy/vaginal delivery and an IAB last year . Patient does intend to breast and formula feed. Pregnancy history fully reviewed.  Patient reports  vaginal discharge with odor and some mild irritation, has a history of recurrent BV. . Was unsure about her desire to continue pregnancy which is why she is late to care. Doing well now, no other physical complaints.   HISTORY: OB History  Gravida Para Term Preterm AB Living  3 1 1  0 1 1  SAB IAB Ectopic Multiple Live Births  0 1 0 0 1    # Outcome Date GA Lbr Len/2nd Weight Sex Delivery Anes PTL Lv  3 Current           2 IAB 2023          1 Term 01/01/21 [redacted]w[redacted]d  6 lb 1.9 oz (2.775 kg) F Vag-Spont EPI  LIV     Birth Comments: [id number]     Name: RIKIA, TRINKLE     Apgar1: 8  Apgar5: 9    Last pap smear was done 07/01/20 and was normal  Past Medical History:  Diagnosis Date   Anemia    Chlamydia    Chlamydia contact, treated    Ovarian cyst    Trichomonas infection    Past Surgical History:  Procedure Laterality Date   WISDOM TOOTH EXTRACTION     Family History  Problem Relation Age of Onset   Healthy Mother    Healthy Father    Hypertension Maternal Grandmother    Social History   Tobacco Use   Smoking status: Never   Smokeless tobacco: Never  Vaping Use   Vaping Use: Never used  Substance Use Topics   Alcohol use: Not Currently    Comment: weekends   Drug use: Yes    Types: Marijuana   No Known Allergies Current Outpatient Medications on File Prior to Visit  Medication Sig Dispense Refill   Blood Pressure Monitoring (BLOOD PRESSURE KIT) DEVI 1 each by Does not apply route as needed. (Patient not taking: Reported on 10/27/2022) 1 each 0   metroNIDAZOLE (METROGEL) 0.75 % vaginal gel Place 1 Applicatorful  vaginally at bedtime. Apply one applicatorful to vagina at bedtime for 5 days (Patient not taking: Reported on 10/20/2022) 70 g 1   [DISCONTINUED] cetirizine (ZYRTEC) 10 MG tablet Take 1 tablet (10 mg total) by mouth daily. 30 tablet 0   No current facility-administered medications on file prior to visit.   Review of Systems Pertinent items noted in HPI and remainder of comprehensive ROS otherwise negative.  Physical Exam:   Vitals:   10/27/22 1021  BP: 101/70  Pulse: 76  Weight: 133 lb 1.6 oz (60.4 kg)   Fetal Heart Rate (bpm): 140  Constitutional: Well-developed, well-nourished pregnant female in no acute distress.  HEENT: PERRLA Skin: normal color and turgor, no rash Cardiovascular: normal rate & rhythm, no murmur Respiratory: normal effort, lung sounds clear throughout GI: Abd soft, non-tender, non-distended, gravid appropriate for gestational age MS: Extremities nontender, no edema, normal ROM Neurologic: Alert and oriented x 4.  GU: no CVA tenderness Pelvic: exam deferred, self-swabs collected  Assessment:   Pregnancy: G3P1011 Patient Active Problem List   Diagnosis Date Noted   Alpha thalassemia silent carrier 10/28/2022   Supervision of low-risk first pregnancy  10/20/2022   History of COVID-19 01/01/2021   Rubella non-immune status, antepartum 07/03/2020   Plan:  1. Encounter for supervision of low-risk first pregnancy in second trimester - CBC/D/Plt+RPR+Rh+ABO+RubIgG... - HgB A1c - Culture, OB Urine - Panorama Prenatal Test Full Panel - Prenatal Vit-Fe Phos-FA-Omega (VITAFOL GUMMIES) 3.33-0.333-34.8 MG CHEW; Chew 3 tablets by mouth daily in the afternoon.  Dispense: 90 tablet; Refill: 11 - aspirin EC 81 MG tablet; Take 1 tablet (81 mg total) by mouth daily. Swallow whole.  Dispense: 30 tablet; Refill: 12  2. [redacted] weeks gestation of pregnancy - Routine OB care   3. Recurrent vaginitis - Genital Mycoplasmas NAA, Swab  4. Iron deficiency anemia secondary to  inadequate dietary iron intake - Iron Combinations (NIFEREX) TABS; Take 1 tablet by mouth daily after breakfast.  Dispense: 30 tablet; Refill: 9  5. Alpha thalassemia silent carrier - Declined partner testing  6. Initial obstetric visit in second trimester - Initial labs drawn. - Continue prenatal vitamins. - Problem list reviewed and updated. - Genetic Screening discussed, Quad screen and NIPS: ordered. - Ultrasound discussed; fetal anatomic survey: ordered. - Anticipatory guidance about prenatal visits given including labs, ultrasounds, and testing. - Discussed usage of Babyscripts and virtual visits as additional source of managing and completing prenatal visits in midst of coronavirus and pandemic.   - Encouraged to complete MyChart Registration for her ability to review results, send requests, and have questions addressed.  - The nature of [facility] with multiple MDs and Advanced Practice Providers was explained to patient. Patient requests CNM care. - Routine obstetric precautions reviewed. Encouraged to seek out care at office or emergency room [facility] preferred) for urgent and/or emergent concerns.  Return in about 3 weeks (around 11/17/2022) for IN-PERSON, LOB.     Edd Arbour, MSN, CNM, IBCLC Certified Nurse Midwife, [facility]

## 2022-10-29 ENCOUNTER — Encounter: Payer: Self-pay | Admitting: Certified Nurse Midwife

## 2022-10-29 LAB — URINE CULTURE, OB REFLEX

## 2022-10-29 LAB — CULTURE, OB URINE

## 2022-11-01 IMAGING — US US OB TRANSVAGINAL
1 series · 15 of 28 positions shown · non-contrast
Comparison: 05/12/2020.

CLINICAL DATA: 19-year-old female is pregnant in the 1st trimester.
Early intrauterine gestational sac suspected on 05/12/2020.
estimated gestational age by LMP 8 weeks 0 days.

EXAM:
TRANSVAGINAL OB ULTRASOUND
TECHNIQUE: Transvaginal ultrasound was performed for complete evaluation of the
gestation as well as the maternal uterus, adnexal regions, and
pelvic cul-de-sac.

[Series 1: us ob transvaginal · 15 of 46 slices shown]
[im 1/46]
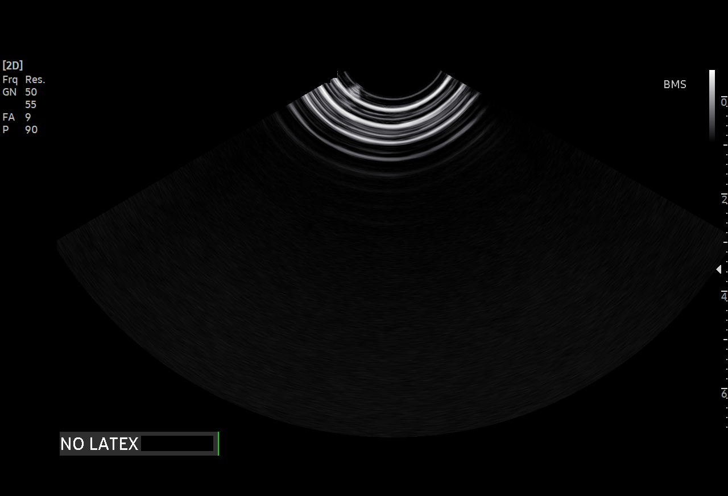
[im 4/46]
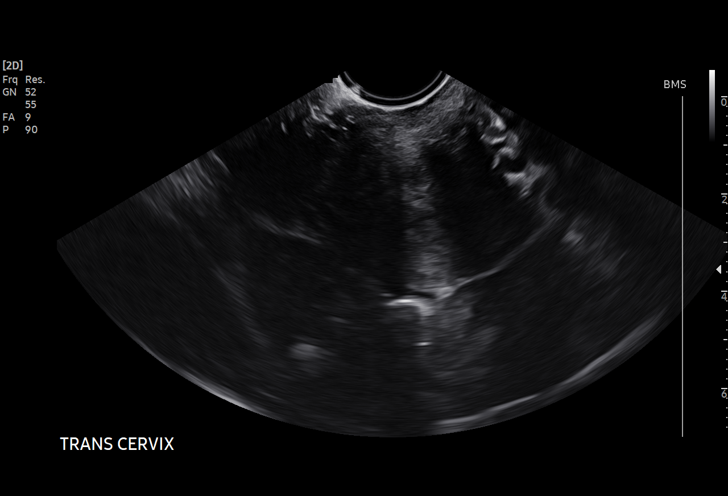
[im 7/46]
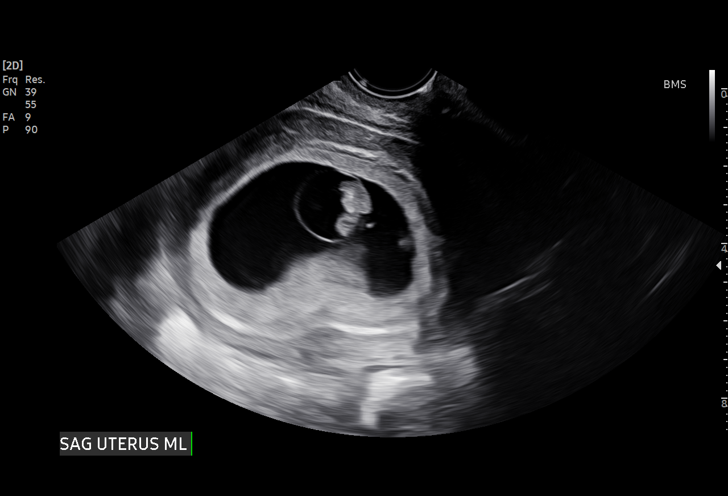
[im 11/46]
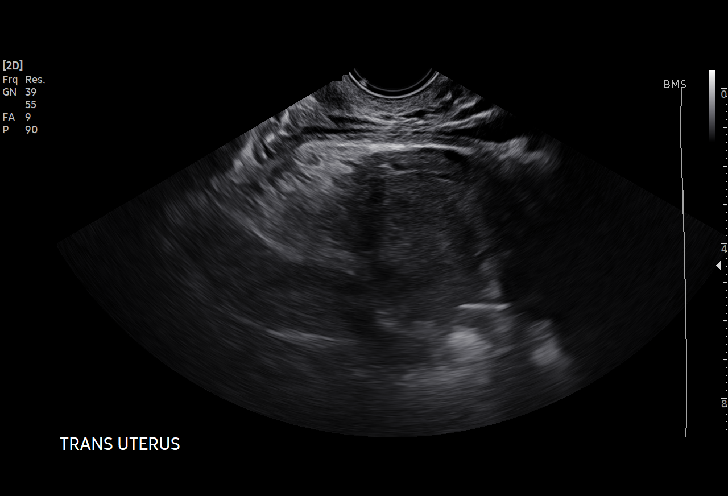
[im 14/46]
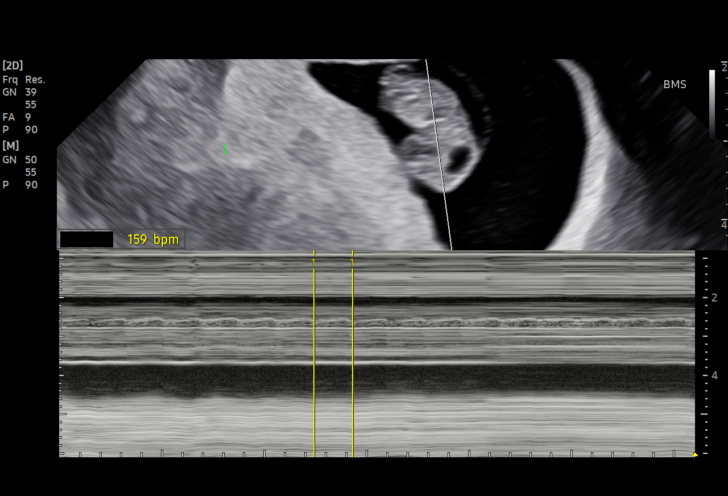
[im 17/46]
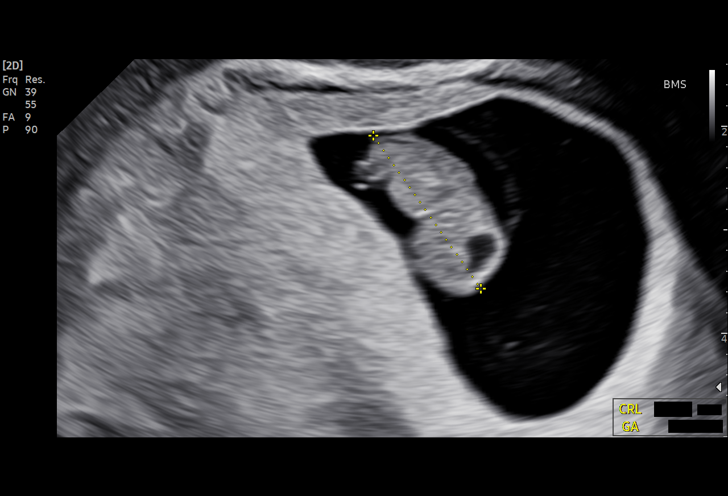
[im 21/46]
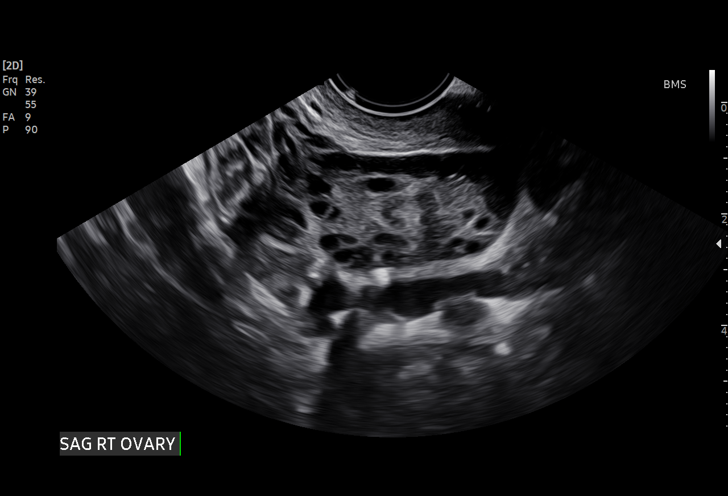
[im 24/46]
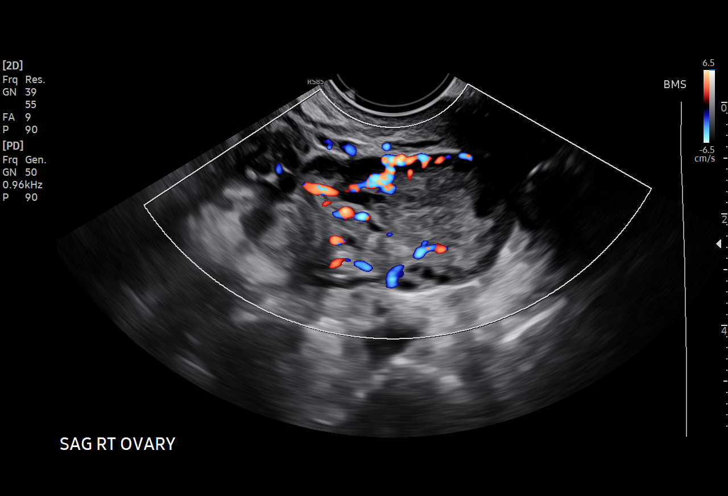
[im 26/46]
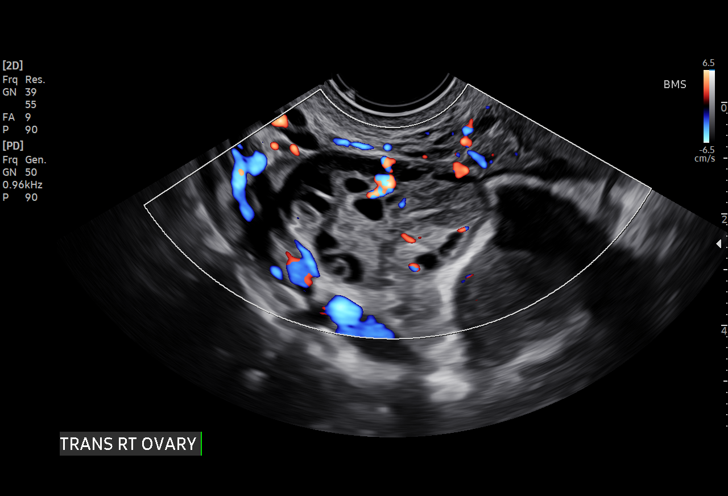
[im 29/46]
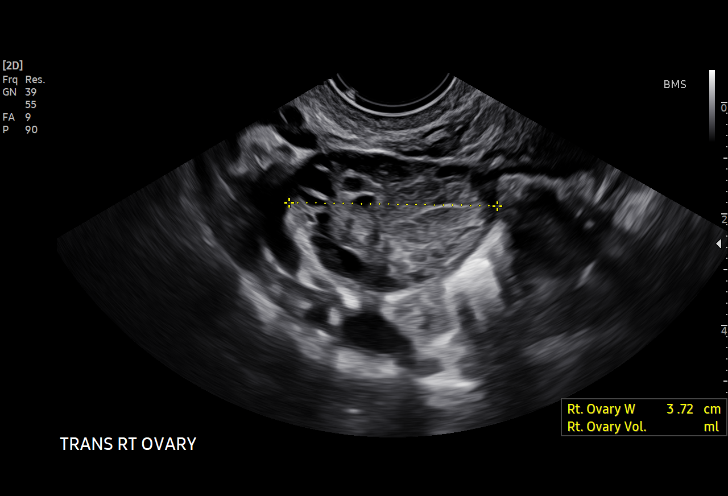
[im 32/46]
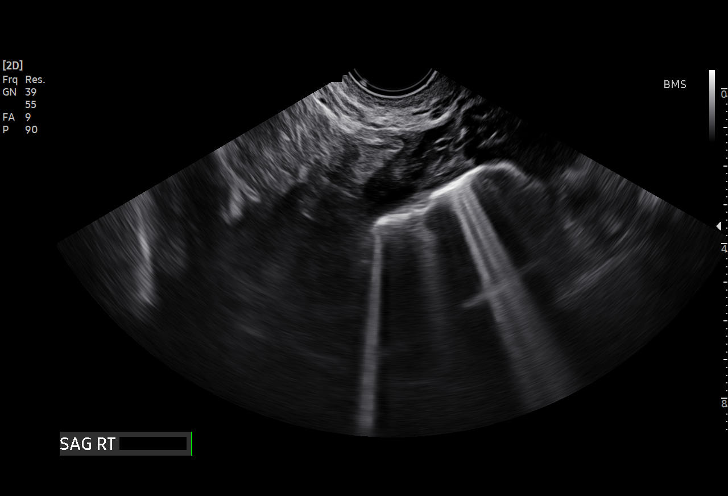
[im 36/46]
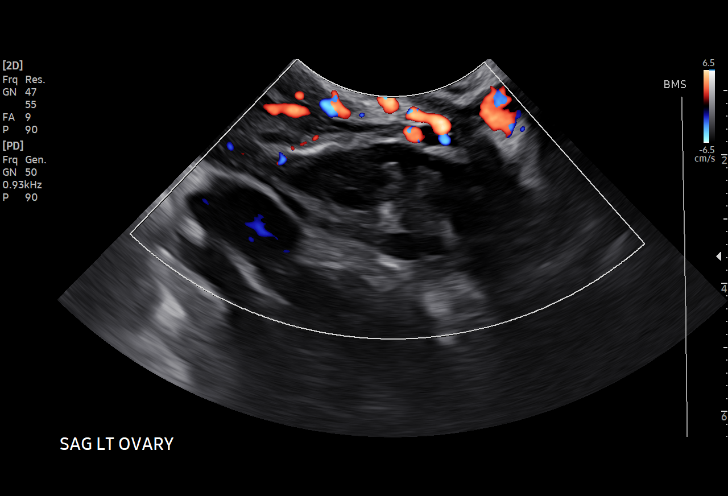
[im 39/46]
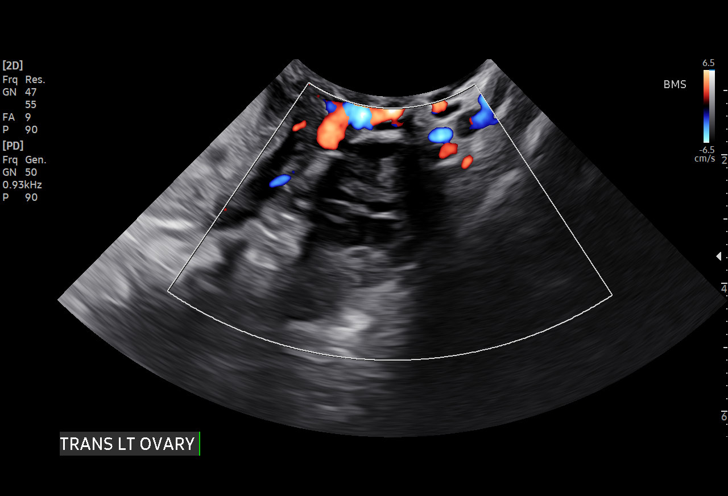
[im 42/46]
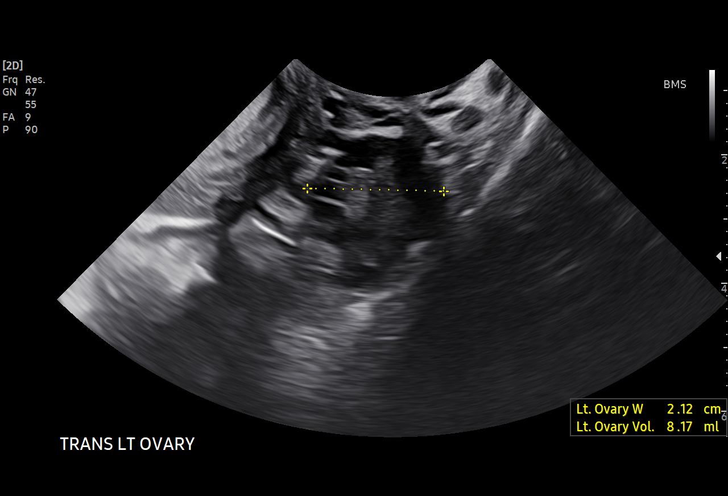
[im 46/46]
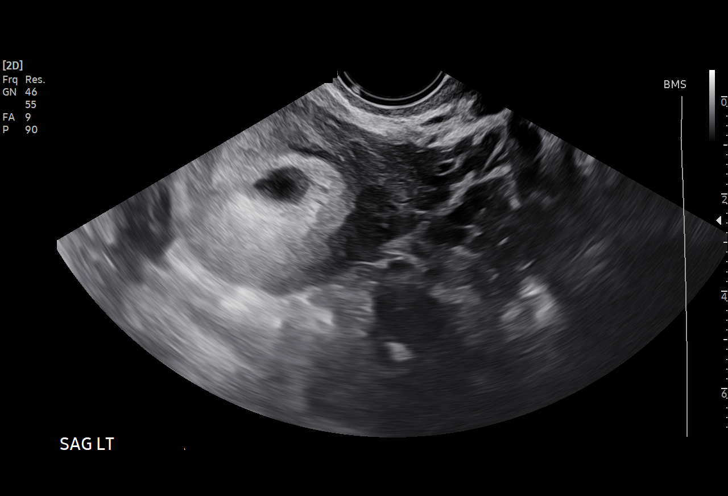

[15 of 28 positions shown; findings below may reference images not displayed]

FINDINGS: Intrauterine gestational sac: Single

Yolk sac:  Visible

Embryo:  Visible

Cardiac Activity: Detected

Heart Rate: 159 bpm

CRL:   17.3 mm   8 w 2 d                  US EDC: 01/12/2021

Subchorionic hemorrhage:  Trace (image 14).

Maternal uterus/adnexae: No pelvic free fluid. Both ovaries appear
normal. The right ovary is 3.7 x 2.0 by 3.7 cm with multiple small
follicles. The left ovary is 4.1 by 1.8 x 2.1 with several small
follicles.
IMPRESSION: Single living IUP demonstrated with estimated gestational age by
crown-rump length 8 weeks 2 days. Trace subchorionic hemorrhage
suspected.

## 2022-11-02 LAB — GENITAL MYCOPLASMAS NAA, SWAB
Mycoplasma genitalium NAA: POSITIVE — AB
Mycoplasma hominis NAA: POSITIVE — AB
Ureaplasma spp NAA: POSITIVE — AB

## 2022-11-03 MED ORDER — AZITHROMYCIN 500 MG PO TABS
500.0000 mg | ORAL_TABLET | Freq: Once | ORAL | 0 refills | Status: AC
Start: 2022-11-03 — End: 2022-11-03

## 2022-11-03 MED ORDER — AZITHROMYCIN 250 MG PO TABS
250.0000 mg | ORAL_TABLET | Freq: Two times a day (BID) | ORAL | 0 refills | Status: AC
Start: 2022-11-03 — End: 2022-11-07

## 2022-11-03 NOTE — Addendum Note (Signed)
Addended by: Edd Arbour on: 11/03/2022 02:05 PM   Modules accepted: Orders

## 2022-11-04 LAB — PANORAMA PRENATAL TEST FULL PANEL:PANORAMA TEST PLUS 5 ADDITIONAL MICRODELETIONS: FETAL FRACTION: 9

## 2022-11-12 ENCOUNTER — Encounter: Payer: Self-pay | Admitting: Certified Nurse Midwife

## 2022-11-15 NOTE — Telephone Encounter (Signed)
Duplicate message sent by patient. Addressed in separate encounter.

## 2022-11-17 ENCOUNTER — Other Ambulatory Visit: Payer: Self-pay

## 2022-11-17 ENCOUNTER — Ambulatory Visit (INDEPENDENT_AMBULATORY_CARE_PROVIDER_SITE_OTHER): Payer: Medicaid Other | Admitting: Certified Nurse Midwife

## 2022-11-17 ENCOUNTER — Encounter: Payer: Medicaid Other | Admitting: Certified Nurse Midwife

## 2022-11-17 VITALS — BP 114/70 | HR 73 | Wt 135.8 lb

## 2022-11-17 DIAGNOSIS — Z3A24 24 weeks gestation of pregnancy: Secondary | ICD-10-CM

## 2022-11-17 DIAGNOSIS — O99612 Diseases of the digestive system complicating pregnancy, second trimester: Secondary | ICD-10-CM

## 2022-11-17 DIAGNOSIS — K5900 Constipation, unspecified: Secondary | ICD-10-CM

## 2022-11-17 DIAGNOSIS — Z3402 Encounter for supervision of normal first pregnancy, second trimester: Secondary | ICD-10-CM

## 2022-11-17 MED ORDER — POLYETHYLENE GLYCOL 3350 17 GM/SCOOP PO POWD
1.0000 | Freq: Once | ORAL | 0 refills | Status: AC
Start: 2022-11-17 — End: 2022-11-17

## 2022-11-17 MED ORDER — MAG-OXIDE 200 MG PO TABS
400.0000 mg | ORAL_TABLET | Freq: Every day | ORAL | 3 refills | Status: DC
Start: 2022-11-17 — End: 2023-03-02

## 2022-11-17 NOTE — Progress Notes (Signed)
   PRENATAL VISIT NOTE  Subjective:  Mary Vincent is a 22 y.o. G3P1011 at [redacted]w[redacted]d being seen today for ongoing prenatal care.  She is currently monitored for the following issues for this low-risk pregnancy and has Rubella non-immune status, antepartum; History of COVID-19; Supervision of low-risk first pregnancy; and Alpha thalassemia silent carrier on their problem list.  Patient reports  constipation .  Contractions: Irritability. Vag. Bleeding: None.  Movement: Present. Denies leaking of fluid.   The following portions of the patient's history were reviewed and updated as appropriate: allergies, current medications, past family history, past medical history, past social history, past surgical history and problem list.   Objective:   Vitals:   11/17/22 0912  BP: 114/70  Pulse: 73  Weight: 135 lb 12.8 oz (61.6 kg)   Fetal Status: Fetal Heart Rate (bpm): 138 Fundal Height: 24 cm Movement: Present     General:  Alert, oriented and cooperative. Patient is in no acute distress.  Skin: Skin is warm and dry. No rash noted.   Cardiovascular: Normal heart rate noted  Respiratory: Normal respiratory effort, no problems with respiration noted  Abdomen: Soft, gravid, appropriate for gestational age.  Pain/Pressure: Present     Pelvic: Cervical exam deferred        Extremities: Normal range of motion.  Edema: None  Mental Status: Normal mood and affect. Normal behavior. Normal judgment and thought content.   Assessment and Plan:  Pregnancy: G3P1011 at [redacted]w[redacted]d 1. Encounter for supervision of low-risk first pregnancy in second trimester - Doing well overall, feeling fetal movement  2. [redacted] weeks gestation of pregnancy - Routine OB care including anticipatory guidance re GTT at next visit  3. Constipation during pregnancy in second trimester - polyethylene glycol powder (MIRALAX) 17 GM/SCOOP powder; Take 255 g by mouth once for 1 dose.  Dispense: 255 g; Refill: 0 - Magnesium Oxide -Mg  Supplement (MAG-OXIDE) 200 MG TABS; Take 2 tablets (400 mg total) by mouth at bedtime. If that amount causes loose stools in the am, switch to 200mg  daily at bedtime.  Dispense: 60 tablet; Refill: 3  Preterm labor symptoms and general obstetric precautions including but not limited to vaginal bleeding, contractions, leaking of fluid and fetal movement were reviewed in detail with the patient. Please refer to After Visit Summary for other counseling recommendations.   Return in about 2 weeks (around 12/01/2022) for IN-PERSON, LOB/GTT.  Future Appointments  Date Time Provider Department Center  11/25/2022  8:15 AM [facility] NURSE [facility]  11/25/2022  8:30 AM [facility] US3 [facility]  12/01/2022  8:50 AM [facility]  12/01/2022  3:55 PM Dan Humphreys, Judye Bos, CNM [facility]   Bernerd Limbo, CNM

## 2022-11-22 DIAGNOSIS — O0930 Supervision of pregnancy with insufficient antenatal care, unspecified trimester: Secondary | ICD-10-CM | POA: Insufficient documentation

## 2022-11-25 ENCOUNTER — Ambulatory Visit: Payer: Medicaid Other

## 2022-11-25 DIAGNOSIS — O0932 Supervision of pregnancy with insufficient antenatal care, second trimester: Secondary | ICD-10-CM

## 2022-11-26 ENCOUNTER — Ambulatory Visit (HOSPITAL_BASED_OUTPATIENT_CLINIC_OR_DEPARTMENT_OTHER): Payer: Medicaid Other

## 2022-11-26 ENCOUNTER — Other Ambulatory Visit: Payer: Self-pay | Admitting: Certified Nurse Midwife

## 2022-11-26 ENCOUNTER — Encounter: Payer: Self-pay | Admitting: *Deleted

## 2022-11-26 ENCOUNTER — Ambulatory Visit: Payer: Medicaid Other | Attending: Certified Nurse Midwife | Admitting: *Deleted

## 2022-11-26 VITALS — BP 98/52 | HR 68

## 2022-11-26 DIAGNOSIS — Z3A19 19 weeks gestation of pregnancy: Secondary | ICD-10-CM

## 2022-11-26 DIAGNOSIS — Z363 Encounter for antenatal screening for malformations: Secondary | ICD-10-CM | POA: Insufficient documentation

## 2022-11-26 DIAGNOSIS — Z3402 Encounter for supervision of normal first pregnancy, second trimester: Secondary | ICD-10-CM

## 2022-11-26 DIAGNOSIS — Z3482 Encounter for supervision of other normal pregnancy, second trimester: Secondary | ICD-10-CM | POA: Diagnosis not present

## 2022-11-26 DIAGNOSIS — Z3A25 25 weeks gestation of pregnancy: Secondary | ICD-10-CM | POA: Insufficient documentation

## 2022-11-26 DIAGNOSIS — D569 Thalassemia, unspecified: Secondary | ICD-10-CM | POA: Diagnosis not present

## 2022-11-26 DIAGNOSIS — O0932 Supervision of pregnancy with insufficient antenatal care, second trimester: Secondary | ICD-10-CM | POA: Insufficient documentation

## 2022-11-26 DIAGNOSIS — Z3689 Encounter for other specified antenatal screening: Secondary | ICD-10-CM

## 2022-11-29 ENCOUNTER — Other Ambulatory Visit: Payer: Self-pay

## 2022-11-29 DIAGNOSIS — Z3400 Encounter for supervision of normal first pregnancy, unspecified trimester: Secondary | ICD-10-CM

## 2022-12-01 ENCOUNTER — Ambulatory Visit: Payer: Medicaid Other | Admitting: Certified Nurse Midwife

## 2022-12-01 ENCOUNTER — Other Ambulatory Visit: Payer: Medicaid Other

## 2022-12-01 DIAGNOSIS — Z3A26 26 weeks gestation of pregnancy: Secondary | ICD-10-CM

## 2022-12-01 DIAGNOSIS — Z3402 Encounter for supervision of normal first pregnancy, second trimester: Secondary | ICD-10-CM

## 2022-12-01 DIAGNOSIS — O09899 Supervision of other high risk pregnancies, unspecified trimester: Secondary | ICD-10-CM

## 2022-12-02 NOTE — Progress Notes (Signed)
Pt did not come to visit.

## 2022-12-14 NOTE — Progress Notes (Unsigned)
   PRENATAL VISIT NOTE  Subjective:  Mary Vincent is a 22 y.o. G3P1011 at [redacted]w[redacted]d being seen today for ongoing prenatal care.  She is currently monitored for the following issues for this low-risk pregnancy and has Rubella non-immune status, antepartum; History of COVID-19; Supervision of low-risk first pregnancy; Alpha thalassemia silent carrier; and Late prenatal care affecting pregnancy on their problem list.  Patient reports {sx:14538}.   .  .   . Denies leaking of fluid.   The following portions of the patient's history were reviewed and updated as appropriate: allergies, current medications, past family history, past medical history, past social history, past surgical history and problem list.   Objective:  There were no vitals filed for this visit.  Fetal Status:           General:  Alert, oriented and cooperative. Patient is in no acute distress.  Skin: Skin is warm and dry. No rash noted.   Cardiovascular: Normal heart rate noted  Respiratory: Normal respiratory effort, no problems with respiration noted  Abdomen: Soft, gravid, appropriate for gestational age.        Pelvic: Cervical exam deferred        Extremities: Normal range of motion.     Mental Status: Normal mood and affect. Normal behavior. Normal judgment and thought content.   Assessment and Plan:  Pregnancy: G3P1011 at [redacted]w[redacted]d 1. Encounter for supervision of low-risk first pregnancy in third trimester - Doing well, feeling regular and vigorous fetal movement   2. [redacted] weeks gestation of pregnancy - Routine OB care including GTT today - Pt is interested in waterbirth.  No contraindications at this time per chart review/patient assessment (discussed this may change if she develops A2GDM).   - Pt to enroll in class, already sees CNM for visits in the office.  - Discussed waterbirth as option for low-risk pregnancy.  Reviewed conditions that may arise during pregnancy that will risk pt out of waterbirth including  hypertension, diabetes, fetal growth restriction <10%ile, etc.  3. Rubella non-immune status, antepartum - Will offer MMR prior to [facility] discharge  Preterm labor symptoms and general obstetric precautions including but not limited to vaginal bleeding, contractions, leaking of fluid and fetal movement were reviewed in detail with the patient. Please refer to After Visit Summary for other counseling recommendations.   No follow-ups on file.  Future Appointments  Date Time Provider Department Center  12/15/2022 10:30 AM [facility]  12/15/2022 11:15 AM Bernerd Limbo, CNM [facility]    Bernerd Limbo, CNM

## 2022-12-15 ENCOUNTER — Other Ambulatory Visit: Payer: Medicaid Other

## 2022-12-15 ENCOUNTER — Ambulatory Visit (INDEPENDENT_AMBULATORY_CARE_PROVIDER_SITE_OTHER): Payer: Medicaid Other | Admitting: Certified Nurse Midwife

## 2022-12-15 VITALS — BP 98/71 | HR 76 | Wt 140.0 lb

## 2022-12-15 DIAGNOSIS — Z3400 Encounter for supervision of normal first pregnancy, unspecified trimester: Secondary | ICD-10-CM

## 2022-12-15 DIAGNOSIS — Z3A28 28 weeks gestation of pregnancy: Secondary | ICD-10-CM

## 2022-12-15 DIAGNOSIS — O09899 Supervision of other high risk pregnancies, unspecified trimester: Secondary | ICD-10-CM

## 2022-12-15 DIAGNOSIS — O09893 Supervision of other high risk pregnancies, third trimester: Secondary | ICD-10-CM

## 2022-12-15 DIAGNOSIS — Z3403 Encounter for supervision of normal first pregnancy, third trimester: Secondary | ICD-10-CM

## 2022-12-15 DIAGNOSIS — Z2839 Other underimmunization status: Secondary | ICD-10-CM

## 2022-12-16 ENCOUNTER — Encounter: Payer: Self-pay | Admitting: General Practice

## 2022-12-16 LAB — CBC
Hematocrit: 30.4 % — ABNORMAL LOW (ref 34.0–46.6)
Hemoglobin: 9.2 g/dL — ABNORMAL LOW (ref 11.1–15.9)
MCH: 21.8 pg — ABNORMAL LOW (ref 26.6–33.0)
MCHC: 30.3 g/dL — ABNORMAL LOW (ref 31.5–35.7)
MCV: 72 fL — ABNORMAL LOW (ref 79–97)
Platelets: 149 10*3/uL — ABNORMAL LOW (ref 150–450)
RBC: 4.22 x10E6/uL (ref 3.77–5.28)
RDW: 15.7 % — ABNORMAL HIGH (ref 11.7–15.4)
WBC: 5.4 10*3/uL (ref 3.4–10.8)

## 2022-12-16 LAB — GLUCOSE TOLERANCE, 2 HOURS W/ 1HR
Glucose, 1 hour: 119 mg/dL (ref 70–179)
Glucose, 2 hour: 86 mg/dL (ref 70–152)
Glucose, Fasting: 74 mg/dL (ref 70–91)

## 2022-12-16 LAB — RPR: RPR Ser Ql: NONREACTIVE

## 2022-12-16 LAB — HIV ANTIBODY (ROUTINE TESTING W REFLEX): HIV Screen 4th Generation wRfx: NONREACTIVE

## 2022-12-29 ENCOUNTER — Encounter: Payer: Self-pay | Admitting: Family Medicine

## 2022-12-29 NOTE — Assessment & Plan Note (Signed)
partner test declined

## 2022-12-29 NOTE — Assessment & Plan Note (Signed)
6/21: Est. FW: 846 gm 1 lb 14 oz 34 % , AFI nml

## 2022-12-29 NOTE — Progress Notes (Unsigned)
PRENATAL VISIT NOTE  Subjective:  Azayla Polo is a 22 y.o. G3P1011 at [redacted]w[redacted]d being seen today for ongoing prenatal care.  She is currently monitored for the following issues for this low-risk pregnancy and has Rubella non-immune status, antepartum; History of COVID-19; Supervision of low-risk first pregnancy; Alpha thalassemia silent carrier; and Late prenatal care affecting pregnancy on their problem list.  Patient reports {sx:14538}. ***   .  .   . Denies leaking of fluid.   The following portions of the patient's history were reviewed and updated as appropriate: allergies, current medications, past family history, past medical history, past social history, past surgical history and problem list.   Objective:  There were no vitals filed for this visit.  Fetal Status:           General:  Alert, oriented and cooperative. Patient is in no acute distress.  Skin: Skin is warm and dry. No rash noted.   Cardiovascular: Normal heart rate noted  Respiratory: Normal respiratory effort, no problems with respiration noted  Abdomen: Soft, gravid, appropriate for gestational age.        Pelvic: {Blank single:19197::"Cervical exam performed in the presence of a chaperone","Cervical exam deferred"}        Extremities: Normal range of motion.     Mental Status: Normal mood and affect. Normal behavior. Normal judgment and thought content.   Assessment and Plan:  Pregnancy: G3P1011 at [redacted]w[redacted]d  Encounter for supervision of low-risk first pregnancy in third trimester Overview:  NURSING  PROVIDER  [facility], [facility] Dating by LMP c/w U/S at   wks  [facility] Model Traditional Anatomy U/S   Initiated care at  [facility]  English              LAB RESULTS   Support Person FOB Genetics NIPS:             AFP:                NT/IT (FT only)     Carrier Screen [place]: silent carrier alpha thal  Rhogam  O/Positive/-- (05/22 1138) A1C/GTT Early: 5.1            Third  trimester:  Flu Vaccine Declined 10/20/2022    TDaP Vaccine   Blood Type O/Positive/-- (05/22 1138)  Covid Vaccine Received  Antibody Negative (05/22 1138)    Rubella <0.90 (05/22 1138)  Feeding Plan unsure RPR Non Reactive (05/22 1138)  Contraception Yes, undecided  HBsAg Negative (05/22 1138)  Circumcision Yes HIV Non Reactive (05/22 1138)  Pediatrician  [facility] HCVAb Non Reactive (05/22 1138)  Prenatal Classes       Pap Diagnosis  Date Value Ref Range Status  07/01/2020   Final   - Negative for intraepithelial lesion or malignancy (NILM)    BTLConsent  GC/CT Initial:             36wks:  VBAC  Consent  GBS   For PCN allergy, check sensitivities        DME Rx [ ]  BP cuff [ ]  Weight Scale Waterbirth  [ ]  Class [ ]  Consent [ ]  CNM visit  PHQ9 & GAD7 [  ] new OB [  ] 28 weeks  [  ] 36 weeks Induction  [ ]  Orders Entered [ ] Foley Y/N     Assessment & Plan: 6/21: Est. FW: 846 gm 1 lb  14 oz 34 % , AFI nml   Rubella non-immune status, antepartum Overview: MMR pp   Alpha thalassemia silent carrier Assessment & Plan: partner test declined      Preterm labor symptoms and general obstetric precautions including but not limited to vaginal bleeding, contractions, leaking of fluid and fetal movement were reviewed in detail with the patient. Please refer to After Visit Summary for other counseling recommendations.   No follow-ups on file.  Future Appointments  Date Time Provider Department Center  12/29/2022  1:55 PM [facility]  01/12/2023 11:15 AM Bernerd Limbo, CNM [facility]  01/25/2023 11:15 AM Bernerd Limbo, CNM [facility]    Myrtie Hawk, DO FMOB Fellow, Faculty practice [facility] 12/29/22  1:20 PM

## 2023-01-11 NOTE — Progress Notes (Unsigned)
   PRENATAL VISIT NOTE  Subjective:  Mary Vincent is a 22 y.o. G3P1011 at [redacted]w[redacted]d being seen today for ongoing prenatal care.  She is currently monitored for the following issues for this {Blank single:19197::"high-risk","low-risk"} pregnancy and has Rubella non-immune status, antepartum; History of COVID-19; Supervision of low-risk first pregnancy; Alpha thalassemia silent carrier; and Late prenatal care affecting pregnancy on their problem list.  Patient reports {sx:14538}.   .  .   . Denies leaking of fluid.   The following portions of the patient's history were reviewed and updated as appropriate: allergies, current medications, past family history, past medical history, past social history, past surgical history and problem list.   Objective:  There were no vitals filed for this visit.  Fetal Status:           General:  Alert, oriented and cooperative. Patient is in no acute distress.  Skin: Skin is warm and dry. No rash noted.   Cardiovascular: Normal heart rate noted  Respiratory: Normal respiratory effort, no problems with respiration noted  Abdomen: Soft, gravid, appropriate for gestational age.        Pelvic: {Blank single:19197::"Cervical exam performed in the presence of a chaperone","Cervical exam deferred"}        Extremities: Normal range of motion.     Mental Status: Normal mood and affect. Normal behavior. Normal judgment and thought content.   Assessment and Plan:  Pregnancy: G3P1011 at [redacted]w[redacted]d 1. Encounter for supervision of low-risk first pregnancy in third trimester ***  2. [redacted] weeks gestation of pregnancy ***  3. Alpha thalassemia silent carrier ***  {Blank single:19197::"Term","Preterm"} labor symptoms and general obstetric precautions including but not limited to vaginal bleeding, contractions, leaking of fluid and fetal movement were reviewed in detail with the patient. Please refer to After Visit Summary for other counseling recommendations.   No  follow-ups on file.  Future Appointments  Date Time Provider Department Center  01/12/2023 11:15 AM [facility]  01/25/2023 11:15 AM Bernerd Limbo, CNM [facility]    Bernerd Limbo, CNM

## 2023-01-12 ENCOUNTER — Other Ambulatory Visit: Payer: Self-pay

## 2023-01-12 ENCOUNTER — Ambulatory Visit (INDEPENDENT_AMBULATORY_CARE_PROVIDER_SITE_OTHER): Payer: Medicaid Other | Admitting: Certified Nurse Midwife

## 2023-01-12 VITALS — BP 106/69 | HR 76 | Wt 143.0 lb

## 2023-01-12 DIAGNOSIS — O99013 Anemia complicating pregnancy, third trimester: Secondary | ICD-10-CM

## 2023-01-12 DIAGNOSIS — Z23 Encounter for immunization: Secondary | ICD-10-CM

## 2023-01-12 DIAGNOSIS — D509 Iron deficiency anemia, unspecified: Secondary | ICD-10-CM

## 2023-01-12 DIAGNOSIS — Z3A32 32 weeks gestation of pregnancy: Secondary | ICD-10-CM

## 2023-01-12 DIAGNOSIS — D563 Thalassemia minor: Secondary | ICD-10-CM

## 2023-01-12 DIAGNOSIS — Z3403 Encounter for supervision of normal first pregnancy, third trimester: Secondary | ICD-10-CM

## 2023-01-12 MED ORDER — FERROUS SULFATE 325 (65 FE) MG PO TABS
325.0000 mg | ORAL_TABLET | Freq: Every day | ORAL | 3 refills | Status: DC
Start: 2023-01-12 — End: 2023-12-07

## 2023-01-17 IMAGING — US US MFM OB DETAIL+14 WK
1 series · 13 of 28 positions shown · non-contrast
Comparison: none

[Series 1: us mfm ob detail+14 wk · 13 of 122 slices shown]
[im 5/122]
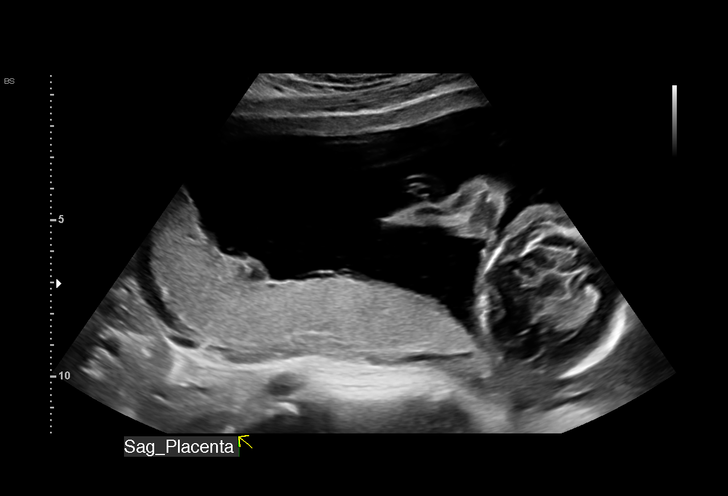
[im 14/122]
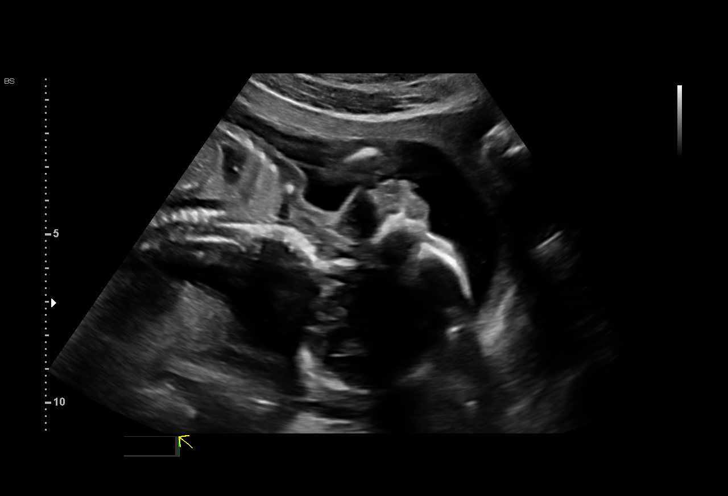
[im 23/122]
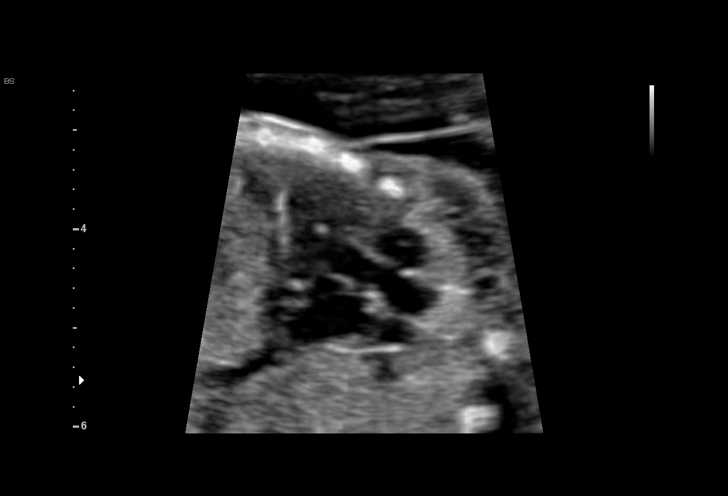
[im 32/122]
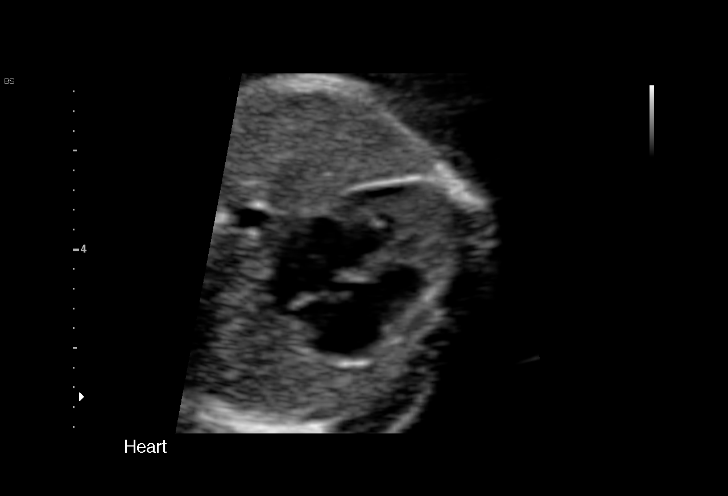
[im 41/122]
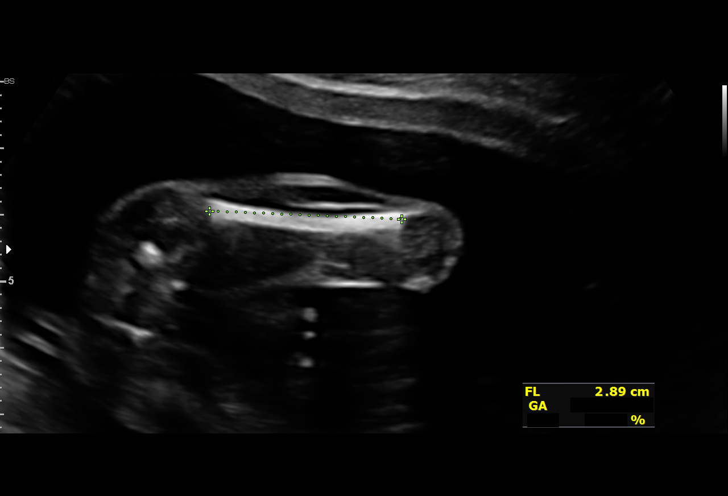
[im 50/122]
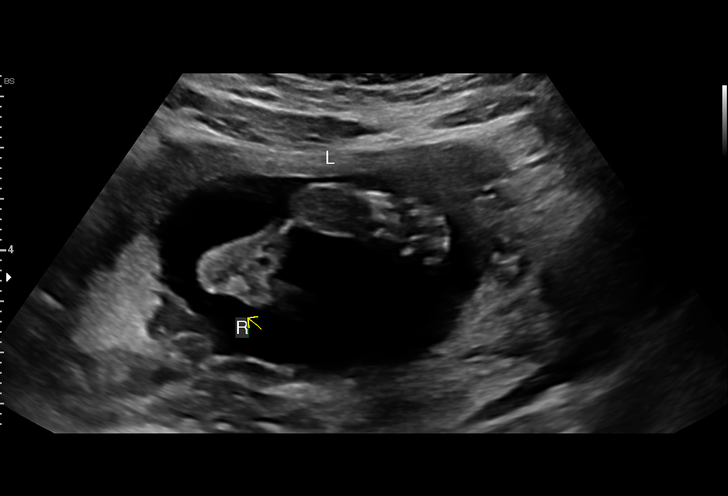
[im 63/122]
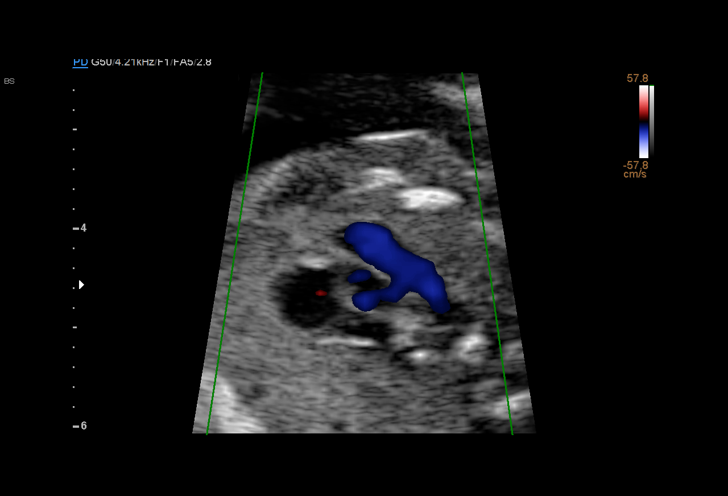
[im 72/122]
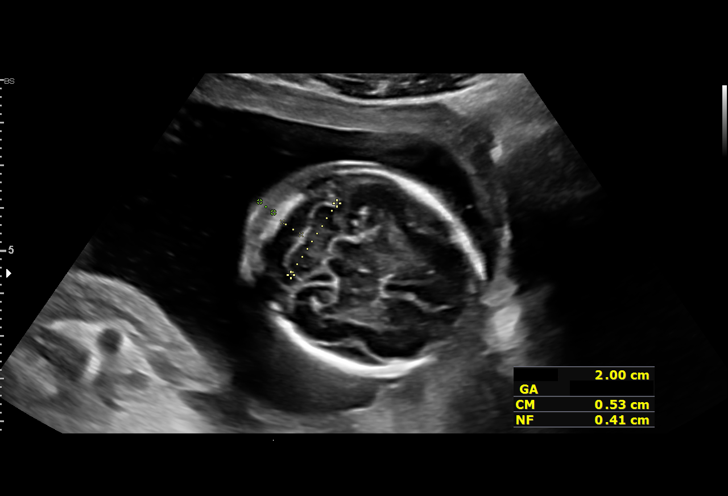
[im 81/122]
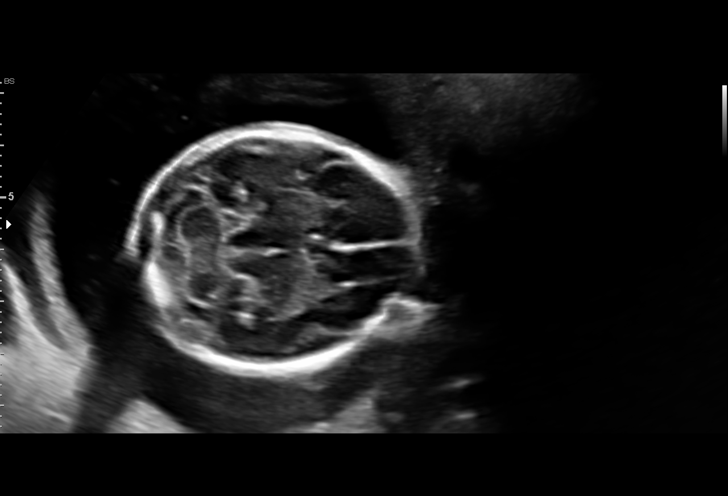
[im 90/122]
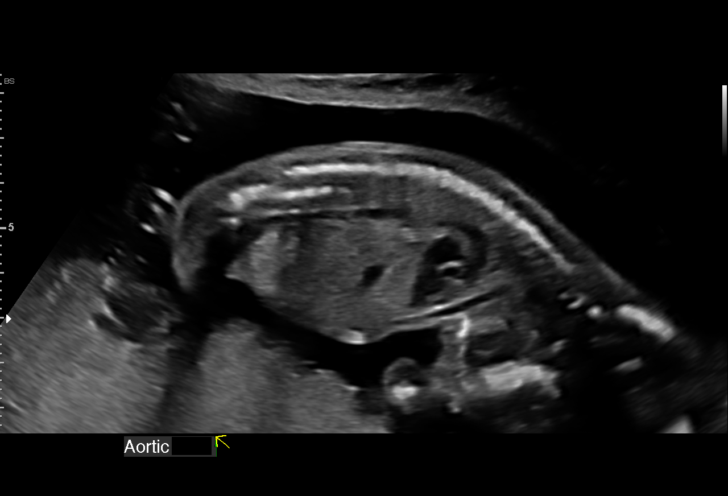
[im 99/122]
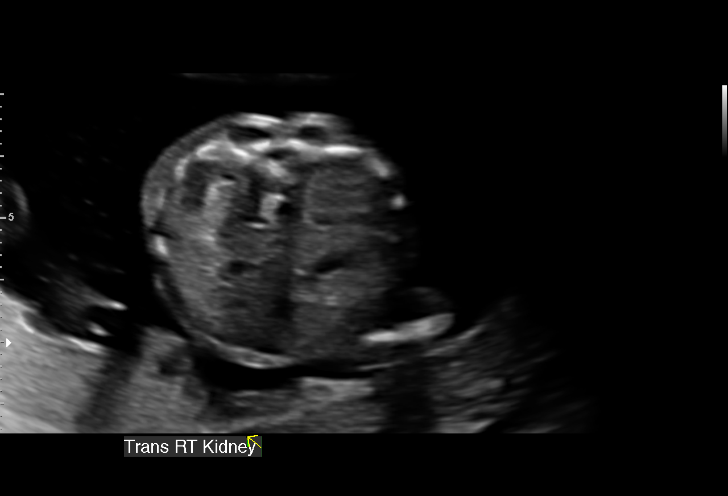
[im 108/122]
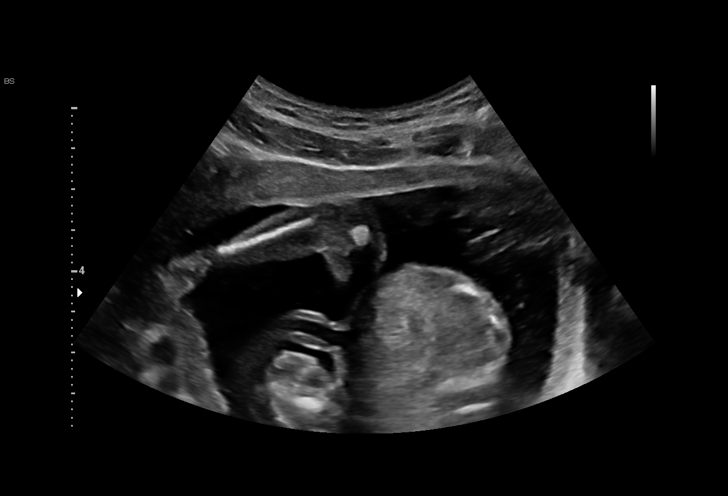
[im 117/122]
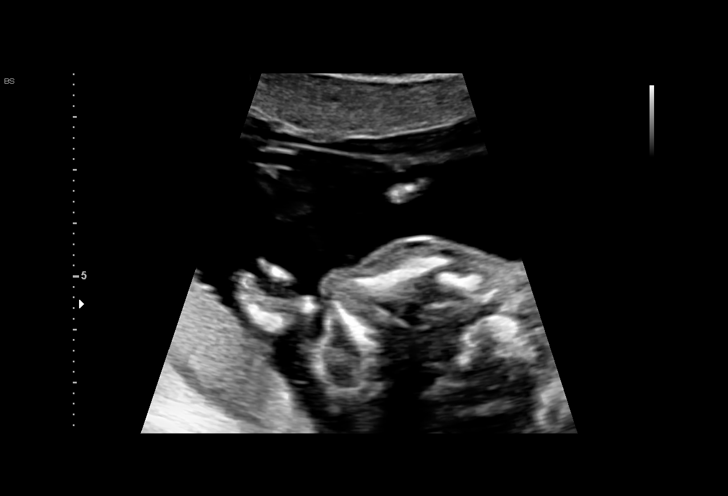

[13 of 28 positions shown; findings below may reference images not displayed]

Indications

 19 weeks gestation of pregnancy
 Encounter for antenatal screening for
 malformations (LR NIPS)
 Genetic carrier (Fuad Boon)
 Echogenic intracardiac focus of the heart
 (EIF)
Fetal Evaluation

 Num Of Fetuses:         1
 Fetal Heart Rate(bpm):  162
 Cardiac Activity:       Observed
 Presentation:           Cephalic
 Placenta:               Posterior Fundal
 P. Cord Insertion:      Visualized, central

 Amniotic Fluid
 AFI FV:      Within normal limits

                             Largest Pocket(cm)

Biometry

 BPD:      45.7  mm     G. Age:  19w 6d         82  %    CI:        76.37   %    70 - 86
                                                         FL/HC:      17.6   %    16.1 -
 HC:      165.7  mm     G. Age:  19w 2d         56  %    HC/AC:      1.22        1.09 -
 AC:      135.4  mm     G. Age:  19w 0d         46  %    FL/BPD:     63.7   %
 FL:       29.1  mm     G. Age:  19w 0d         41  %    FL/AC:      21.5   %    20 - 24
 HUM:      28.2  mm     G. Age:  19w 1d         52  %
 CER:        20  mm     G. Age:  19w 2d         50  %
 NFT:       4.1  mm

 LV:        6.7  mm
 CM:        5.3  mm

 Est. FW:     270  gm    0 lb 10 oz      47  %
Gestational Age

 LMP:           19w 0d        Date:  04/09/20                 EDD:   01/14/21
 U/S Today:     19w 2d                                        EDD:   01/12/21
 Best:          19w 0d     Det. By:  LMP  (04/09/20)          EDD:   01/14/21
Anatomy

 Cranium:               Appears normal         Aortic Arch:            Appears normal
 Cavum:                 Appears normal         Ductal Arch:            Appears normal
 Ventricles:            Appears normal         Diaphragm:              Appears normal
 Choroid Plexus:        Appears normal         Stomach:                Appears normal, left
                                                                       sided
 Cerebellum:            Appears normal         Abdomen:                Appears normal
 Posterior Fossa:       Appears normal         Abdominal Wall:         Appears nml (cord
                                                                       insert, abd wall)
 Nuchal Fold:           Appears normal         Cord Vessels:           Appears normal (3
                                                                       vessel cord)
 Face:                  Appears normal         Kidneys:                Appear normal
                        (orbits and profile)
 Lips:                  Appears normal         Bladder:                Appears normal
 Thoracic:              Appears normal         Spine:                  Appears normal
 Heart:                 Appears normal; EIF    Upper Extremities:      Appears normal
 RVOT:                  Appears normal         Lower Extremities:      Appears normal
 LVOT:                  Appears normal

 Other:  Lenses visualized. Heels/feet and open hands/5th digits
         visualized.Fetus appears to be female.
Cervix Uterus Adnexa

 Cervix
 Length:           3.68  cm.
 Not visualized (advanced GA >98wks)

 Uterus
 No abnormality visualized.

 Right Ovary
 Not visualized.

 Left Ovary
 Not visualized.

 Cul De Sac
 No free fluid seen.

 Adnexa
 No adnexal mass visualized.
Impression

 G1P0. Patient is here for fetal anatomy scan.
 On cell-free fetal DNA screening, the risks of aneuploidies
 were not increased.
 We performed fetal anatomy scan. An echogenic intracardiac
 focus is seen. No other makers of aneuploidies or fetal
 structural defects are seen. Fetal biometry is consistent with
 her previously-established dates. Amniotic fluid is normal and
 good fetal activity is seen.
 I informed the patient that given that she had Cipriana Cross for fetal
 aneuploidies on cell-free fetal DNA screening, finding of
 echogenic intracardiac focus should be considered a normal
 variant and that the risk of trisomy 21 is not increased. I also
 reassured that echogenic focus does not increase the risk of
 cardiac defects. I also informed her that only amniocentesis
 will give a defintive result on the fetal karyotype.
 Patient opted not to have amniocentesis.
 recommended genetic counseling and partner screening
 (gave her a request form)
 Patient opted to meet with our genetic counselor.
                 Tishi, Rinorit

## 2023-01-24 NOTE — Progress Notes (Unsigned)
   PRENATAL VISIT NOTE  Subjective:  Mary Vincent is a 22 y.o. G3P1011 at [redacted]w[redacted]d being seen today for ongoing prenatal care.  She is currently monitored for the following issues for this {Blank single:19197::"high-risk","low-risk"} pregnancy and has Rubella non-immune status, antepartum; History of COVID-19; Supervision of low-risk pregnancy; Alpha thalassemia silent carrier; and Late prenatal care affecting pregnancy on their problem list.  Patient reports {sx:14538}.   .  .   . Denies leaking of fluid.   The following portions of the patient's history were reviewed and updated as appropriate: allergies, current medications, past family history, past medical history, past social history, past surgical history and problem list.   Objective:  There were no vitals filed for this visit.  Fetal Status:           General:  Alert, oriented and cooperative. Patient is in no acute distress.  Skin: Skin is warm and dry. No rash noted.   Cardiovascular: Normal heart rate noted  Respiratory: Normal respiratory effort, no problems with respiration noted  Abdomen: Soft, gravid, appropriate for gestational age.        Pelvic: {Blank single:19197::"Cervical exam performed in the presence of a chaperone","Cervical exam deferred"}        Extremities: Normal range of motion.     Mental Status: Normal mood and affect. Normal behavior. Normal judgment and thought content.   Assessment and Plan:  Pregnancy: G3P1011 at [redacted]w[redacted]d 1. Encounter for supervision of low-risk pregnancy in third trimester ***  2. [redacted] weeks gestation of pregnancy ***  {Blank single:19197::"Term","Preterm"} labor symptoms and general obstetric precautions including but not limited to vaginal bleeding, contractions, leaking of fluid and fetal movement were reviewed in detail with the patient. Please refer to After Visit Summary for other counseling recommendations.   No follow-ups on file.  Future Appointments  Date Time Provider  Department Center  01/25/2023 11:15 AM Osborne Oman [facility]  02/09/2023  3:15 PM Osborne Oman [facility]  02/16/2023  9:35 AM Bernerd Limbo, CNM [facility]  02/23/2023  9:35 AM Bernerd Limbo, CNM [facility]  03/02/2023 10:15 AM Bernerd Limbo, CNM [facility]  03/09/2023  9:35 AM Bernerd Limbo, CNM [facility]    Bernerd Limbo, CNM

## 2023-01-25 ENCOUNTER — Other Ambulatory Visit: Payer: Self-pay

## 2023-01-25 ENCOUNTER — Ambulatory Visit (INDEPENDENT_AMBULATORY_CARE_PROVIDER_SITE_OTHER): Payer: Medicaid Other | Admitting: Certified Nurse Midwife

## 2023-01-25 VITALS — BP 105/70 | HR 74 | Wt 141.9 lb

## 2023-01-25 DIAGNOSIS — R3915 Urgency of urination: Secondary | ICD-10-CM

## 2023-01-25 DIAGNOSIS — Z3A34 34 weeks gestation of pregnancy: Secondary | ICD-10-CM

## 2023-01-25 DIAGNOSIS — O26893 Other specified pregnancy related conditions, third trimester: Secondary | ICD-10-CM

## 2023-01-25 DIAGNOSIS — O26899 Other specified pregnancy related conditions, unspecified trimester: Secondary | ICD-10-CM

## 2023-01-25 DIAGNOSIS — Z3493 Encounter for supervision of normal pregnancy, unspecified, third trimester: Secondary | ICD-10-CM

## 2023-01-25 LAB — POCT URINALYSIS DIP (DEVICE)
Bilirubin Urine: NEGATIVE
Glucose, UA: NEGATIVE mg/dL
Hgb urine dipstick: NEGATIVE
Ketones, ur: 15 mg/dL — AB
Nitrite: NEGATIVE
Protein, ur: NEGATIVE mg/dL
Specific Gravity, Urine: 1.02 (ref 1.005–1.030)
Urobilinogen, UA: 0.2 mg/dL (ref 0.0–1.0)
pH: 7.5 (ref 5.0–8.0)

## 2023-01-28 LAB — CULTURE, OB URINE

## 2023-01-28 LAB — URINE CULTURE, OB REFLEX

## 2023-02-08 NOTE — Progress Notes (Unsigned)
   PRENATAL VISIT NOTE  Subjective:  Mary Vincent is a 22 y.o. G3P1011 at [redacted]w[redacted]d being seen today for ongoing prenatal care.  She is currently monitored for the following issues for this {Blank single:19197::"high-risk","low-risk"} pregnancy and has Rubella non-immune status, antepartum; History of COVID-19; Supervision of low-risk pregnancy; Alpha thalassemia silent carrier; and Late prenatal care affecting pregnancy on their problem list.  Patient reports {sx:14538}.   .  .   . Denies leaking of fluid.   The following portions of the patient's history were reviewed and updated as appropriate: allergies, current medications, past family history, past medical history, past social history, past surgical history and problem list.   Objective:  There were no vitals filed for this visit.  Fetal Status:           General:  Alert, oriented and cooperative. Patient is in no acute distress.  Skin: Skin is warm and dry. No rash noted.   Cardiovascular: Normal heart rate noted  Respiratory: Normal respiratory effort, no problems with respiration noted  Abdomen: Soft, gravid, appropriate for gestational age.        Pelvic: {Blank single:19197::"Cervical exam performed in the presence of a chaperone","Cervical exam deferred"}        Extremities: Normal range of motion.     Mental Status: Normal mood and affect. Normal behavior. Normal judgment and thought content.   Assessment and Plan:  Pregnancy: G3P1011 at [redacted]w[redacted]d 1. Encounter for supervision of low-risk pregnancy in third trimester ***  2. [redacted] weeks gestation of pregnancy ***  3. Rubella non-immune status, antepartum ***  4. Alpha thalassemia silent carrier ***  {Blank single:19197::"Term","Preterm"} labor symptoms and general obstetric precautions including but not limited to vaginal bleeding, contractions, leaking of fluid and fetal movement were reviewed in detail with the patient. Please refer to After Visit Summary for other  counseling recommendations.   No follow-ups on file.  Future Appointments  Date Time Provider Department Center  02/09/2023  3:15 PM Osborne Oman [facility]  02/16/2023  9:35 AM Bernerd Limbo, CNM [facility]  02/23/2023  9:35 AM Bernerd Limbo, CNM [facility]  03/02/2023 10:15 AM Bernerd Limbo, CNM [facility]  03/09/2023  9:35 AM Bernerd Limbo, CNM [facility]    Bernerd Limbo, CNM

## 2023-02-09 ENCOUNTER — Other Ambulatory Visit (HOSPITAL_COMMUNITY)
Admission: RE | Admit: 2023-02-09 | Discharge: 2023-02-09 | Disposition: A | Discharge status: Home / Self Care | Payer: Medicaid Other | Source: Ambulatory Visit | Attending: Certified Nurse Midwife | Admitting: Certified Nurse Midwife

## 2023-02-09 ENCOUNTER — Ambulatory Visit (INDEPENDENT_AMBULATORY_CARE_PROVIDER_SITE_OTHER): Payer: Medicaid Other | Admitting: Certified Nurse Midwife

## 2023-02-09 ENCOUNTER — Other Ambulatory Visit: Payer: Self-pay

## 2023-02-09 VITALS — BP 95/62 | HR 90 | Wt 143.2 lb

## 2023-02-09 DIAGNOSIS — Z3493 Encounter for supervision of normal pregnancy, unspecified, third trimester: Secondary | ICD-10-CM

## 2023-02-09 DIAGNOSIS — D563 Thalassemia minor: Secondary | ICD-10-CM

## 2023-02-09 DIAGNOSIS — Z3A36 36 weeks gestation of pregnancy: Secondary | ICD-10-CM

## 2023-02-09 DIAGNOSIS — Z2839 Other underimmunization status: Secondary | ICD-10-CM

## 2023-02-09 DIAGNOSIS — B3731 Acute candidiasis of vulva and vagina: Secondary | ICD-10-CM

## 2023-02-09 DIAGNOSIS — O09893 Supervision of other high risk pregnancies, third trimester: Secondary | ICD-10-CM

## 2023-02-11 LAB — CERVICOVAGINAL ANCILLARY ONLY
Bacterial Vaginitis (gardnerella): NEGATIVE
Candida Glabrata: NEGATIVE
Candida Vaginitis: POSITIVE — AB
Chlamydia: NEGATIVE
Comment: NEGATIVE
Comment: NEGATIVE
Comment: NEGATIVE
Comment: NEGATIVE
Comment: NEGATIVE
Comment: NORMAL
Neisseria Gonorrhea: NEGATIVE
Trichomonas: NEGATIVE

## 2023-02-14 LAB — CULTURE, BETA STREP (GROUP B ONLY): Strep Gp B Culture: NEGATIVE

## 2023-02-15 NOTE — Progress Notes (Unsigned)
   PRENATAL VISIT NOTE  Subjective:  Mary Vincent is a 22 y.o. G3P1011 at [redacted]w[redacted]d being seen today for ongoing prenatal care.  She is currently monitored for the following issues for this {Blank single:19197::"high-risk","low-risk"} pregnancy and has Rubella non-immune status, antepartum; History of COVID-19; Supervision of low-risk pregnancy; Alpha thalassemia silent carrier; and Late prenatal care affecting pregnancy on their problem list.  Patient reports {sx:14538}.   .  .   . Denies leaking of fluid.   The following portions of the patient's history were reviewed and updated as appropriate: allergies, current medications, past family history, past medical history, past social history, past surgical history and problem list.   Objective:  There were no vitals filed for this visit.  Fetal Status:           General:  Alert, oriented and cooperative. Patient is in no acute distress.  Skin: Skin is warm and dry. No rash noted.   Cardiovascular: Normal heart rate noted  Respiratory: Normal respiratory effort, no problems with respiration noted  Abdomen: Soft, gravid, appropriate for gestational age.        Pelvic: {Blank single:19197::"Cervical exam performed in the presence of a chaperone","Cervical exam deferred"}        Extremities: Normal range of motion.     Mental Status: Normal mood and affect. Normal behavior. Normal judgment and thought content.   Assessment and Plan:  Pregnancy: G3P1011 at [redacted]w[redacted]d 1. Encounter for supervision of low-risk pregnancy in third trimester ***  2. [redacted] weeks gestation of pregnancy ***  {Blank single:19197::"Term","Preterm"} labor symptoms and general obstetric precautions including but not limited to vaginal bleeding, contractions, leaking of fluid and fetal movement were reviewed in detail with the patient. Please refer to After Visit Summary for other counseling recommendations.   No follow-ups on file.  Future Appointments  Date Time Provider  Department Center  02/16/2023  9:35 AM Osborne Oman [facility]  02/23/2023  9:35 AM Bernerd Limbo, CNM [facility]  03/02/2023 10:15 AM Bernerd Limbo, CNM [facility]  03/09/2023  9:35 AM Bernerd Limbo, CNM [facility]    Bernerd Limbo, CNM

## 2023-02-16 ENCOUNTER — Ambulatory Visit (INDEPENDENT_AMBULATORY_CARE_PROVIDER_SITE_OTHER): Payer: Medicaid Other | Admitting: Certified Nurse Midwife

## 2023-02-16 ENCOUNTER — Other Ambulatory Visit: Payer: Self-pay

## 2023-02-16 VITALS — BP 105/76 | HR 71 | Wt 146.0 lb

## 2023-02-16 DIAGNOSIS — Z3A37 37 weeks gestation of pregnancy: Secondary | ICD-10-CM

## 2023-02-16 DIAGNOSIS — Z3493 Encounter for supervision of normal pregnancy, unspecified, third trimester: Secondary | ICD-10-CM

## 2023-02-21 MED ORDER — TERCONAZOLE 0.4 % VA CREA
1.0000 | TOPICAL_CREAM | Freq: Every day | VAGINAL | 0 refills | Status: AC
Start: 2023-02-21 — End: 2023-02-26

## 2023-02-21 NOTE — Addendum Note (Signed)
Addended by: Edd Arbour on: 02/21/2023 08:32 AM   Modules accepted: Orders

## 2023-02-22 NOTE — Progress Notes (Unsigned)
   PRENATAL VISIT NOTE  Subjective:  Mary Vincent is a 22 y.o. G3P1011 at [redacted]w[redacted]d being seen today for ongoing prenatal care.  She is currently monitored for the following issues for this {Blank single:19197::"high-risk","low-risk"} pregnancy and has Rubella non-immune status, antepartum; History of COVID-19; Supervision of low-risk pregnancy; Alpha thalassemia silent carrier; and Late prenatal care affecting pregnancy on their problem list.  Patient reports {sx:14538}.   .  .   . Denies leaking of fluid.   The following portions of the patient's history were reviewed and updated as appropriate: allergies, current medications, past family history, past medical history, past social history, past surgical history and problem list.   Objective:  There were no vitals filed for this visit.  Fetal Status:           General:  Alert, oriented and cooperative. Patient is in no acute distress.  Skin: Skin is warm and dry. No rash noted.   Cardiovascular: Normal heart rate noted  Respiratory: Normal respiratory effort, no problems with respiration noted  Abdomen: Soft, gravid, appropriate for gestational age.        Pelvic: {Blank single:19197::"Cervical exam performed in the presence of a chaperone","Cervical exam deferred"}        Extremities: Normal range of motion.     Mental Status: Normal mood and affect. Normal behavior. Normal judgment and thought content.   Assessment and Plan:  Pregnancy: G3P1011 at [redacted]w[redacted]d 1. Encounter for supervision of low-risk pregnancy in third trimester ***  2. [redacted] weeks gestation of pregnancy ***  {Blank single:19197::"Term","Preterm"} labor symptoms and general obstetric precautions including but not limited to vaginal bleeding, contractions, leaking of fluid and fetal movement were reviewed in detail with the patient. Please refer to After Visit Summary for other counseling recommendations.   No follow-ups on file.  Future Appointments  Date Time Provider  Department Center  02/23/2023  9:35 AM Osborne Oman [facility]  03/02/2023 10:15 AM Osborne Oman [facility]  03/09/2023  9:35 AM Bernerd Limbo, CNM [facility]    Bernerd Limbo, CNM

## 2023-02-23 ENCOUNTER — Other Ambulatory Visit: Payer: Self-pay

## 2023-02-23 ENCOUNTER — Ambulatory Visit (INDEPENDENT_AMBULATORY_CARE_PROVIDER_SITE_OTHER): Payer: Medicaid Other | Admitting: Certified Nurse Midwife

## 2023-02-23 VITALS — BP 102/69 | HR 82 | Wt 149.0 lb

## 2023-02-23 DIAGNOSIS — Z3A38 38 weeks gestation of pregnancy: Secondary | ICD-10-CM

## 2023-02-23 DIAGNOSIS — B379 Candidiasis, unspecified: Secondary | ICD-10-CM

## 2023-02-23 DIAGNOSIS — Z3493 Encounter for supervision of normal pregnancy, unspecified, third trimester: Secondary | ICD-10-CM

## 2023-02-23 MED ORDER — FLUCONAZOLE 150 MG PO TABS
150.0000 mg | ORAL_TABLET | Freq: Once | ORAL | 0 refills | Status: AC
Start: 2023-02-23 — End: 2023-02-23

## 2023-02-28 ENCOUNTER — Other Ambulatory Visit: Payer: Self-pay

## 2023-02-28 ENCOUNTER — Encounter (HOSPITAL_COMMUNITY): Payer: Self-pay | Admitting: Family Medicine

## 2023-02-28 ENCOUNTER — Inpatient Hospital Stay (HOSPITAL_COMMUNITY)
Admission: RE | Admit: 2023-02-28 | Discharge: 2023-03-02 | DRG: 807 | Disposition: A | Discharge status: Home / Self Care | Payer: Medicaid Other | Attending: Family Medicine | Admitting: Family Medicine

## 2023-02-28 DIAGNOSIS — Z148 Genetic carrier of other disease: Secondary | ICD-10-CM | POA: Diagnosis not present

## 2023-02-28 DIAGNOSIS — O9902 Anemia complicating childbirth: Secondary | ICD-10-CM | POA: Diagnosis present

## 2023-02-28 DIAGNOSIS — Z8249 Family history of ischemic heart disease and other diseases of the circulatory system: Secondary | ICD-10-CM

## 2023-02-28 DIAGNOSIS — Z3A39 39 weeks gestation of pregnancy: Secondary | ICD-10-CM

## 2023-02-28 DIAGNOSIS — O26893 Other specified pregnancy related conditions, third trimester: Secondary | ICD-10-CM | POA: Diagnosis present

## 2023-02-28 DIAGNOSIS — Z8616 Personal history of COVID-19: Secondary | ICD-10-CM | POA: Diagnosis not present

## 2023-02-28 DIAGNOSIS — O4202 Full-term premature rupture of membranes, onset of labor within 24 hours of rupture: Secondary | ICD-10-CM | POA: Diagnosis not present

## 2023-02-28 DIAGNOSIS — Z3493 Encounter for supervision of normal pregnancy, unspecified, third trimester: Secondary | ICD-10-CM

## 2023-02-28 LAB — CBC
HCT: 27.6 % — ABNORMAL LOW (ref 36.0–46.0)
Hemoglobin: 8.7 g/dL — ABNORMAL LOW (ref 12.0–15.0)
MCH: 21.9 pg — ABNORMAL LOW (ref 26.0–34.0)
MCHC: 31.5 g/dL (ref 30.0–36.0)
MCV: 69.3 fL — ABNORMAL LOW (ref 80.0–100.0)
Platelets: 212 10*3/uL (ref 150–400)
RBC: 3.98 MIL/uL (ref 3.87–5.11)
RDW: 16.5 % — ABNORMAL HIGH (ref 11.5–15.5)
WBC: 8 10*3/uL (ref 4.0–10.5)
nRBC: 0 % (ref 0.0–0.2)

## 2023-02-28 LAB — TYPE AND SCREEN
ABO/RH(D): O POS
Antibody Screen: NEGATIVE

## 2023-02-28 MED ORDER — LACTATED RINGERS IV SOLN: INTRAVENOUS | Status: DC

## 2023-02-28 MED ORDER — EPHEDRINE 5 MG/ML INJ: 10.0000 mg | INTRAVENOUS | Status: DC | PRN

## 2023-02-28 MED ORDER — ACETAMINOPHEN 325 MG PO TABS: 650.0000 mg | ORAL_TABLET | ORAL | Status: DC | PRN

## 2023-02-28 MED ORDER — FLEET ENEMA RE ENEM: 1.0000 | ENEMA | RECTAL | Status: DC | PRN

## 2023-02-28 MED ORDER — OXYCODONE-ACETAMINOPHEN 5-325 MG PO TABS: 1.0000 | ORAL_TABLET | ORAL | Status: DC | PRN

## 2023-02-28 MED ORDER — DIPHENHYDRAMINE HCL 50 MG/ML IJ SOLN: 12.5000 mg | INTRAMUSCULAR | Status: DC | PRN

## 2023-02-28 MED ORDER — OXYTOCIN-SODIUM CHLORIDE 30-0.9 UT/500ML-% IV SOLN
1.0000 m[IU]/min | INTRAVENOUS | Status: DC
  Administered 2023-02-28: 2 m[IU]/min via INTRAVENOUS
  Administered 2023-03-01: 1 m[IU]/min via INTRAVENOUS

## 2023-02-28 MED ORDER — TERBUTALINE SULFATE 1 MG/ML IJ SOLN: 0.2500 mg | Freq: Once | INTRAMUSCULAR | Status: DC | PRN

## 2023-02-28 MED ORDER — MISOPROSTOL 50MCG HALF TABLET: 50.0000 ug | ORAL_TABLET | ORAL | Status: DC | PRN

## 2023-02-28 MED ORDER — FENTANYL CITRATE (PF) 100 MCG/2ML IJ SOLN
50.0000 ug | INTRAMUSCULAR | Status: DC | PRN
  Administered 2023-03-01: 50 ug via INTRAVENOUS
  Filled 2023-02-28: qty 2

## 2023-02-28 MED ORDER — FENTANYL-BUPIVACAINE-NACL 0.5-0.125-0.9 MG/250ML-% EP SOLN
12.0000 mL/h | EPIDURAL | Status: DC | PRN
  Administered 2023-03-01: 12 mL/h via EPIDURAL
  Filled 2023-02-28: qty 250

## 2023-02-28 MED ORDER — LACTATED RINGERS IV SOLN
500.0000 mL | INTRAVENOUS | Status: DC | PRN
  Administered 2023-03-01: 500 mL via INTRAVENOUS

## 2023-02-28 MED ORDER — OXYCODONE-ACETAMINOPHEN 5-325 MG PO TABS: 2.0000 | ORAL_TABLET | ORAL | Status: DC | PRN

## 2023-02-28 MED ORDER — LIDOCAINE HCL (PF) 1 % IJ SOLN: 30.0000 mL | INTRAMUSCULAR | Status: DC | PRN

## 2023-02-28 MED ORDER — PHENYLEPHRINE 80 MCG/ML (10ML) SYRINGE FOR IV PUSH (FOR BLOOD PRESSURE SUPPORT)
80.0000 ug | PREFILLED_SYRINGE | INTRAVENOUS | Status: DC | PRN
  Filled 2023-02-28: qty 10

## 2023-02-28 MED ORDER — PHENYLEPHRINE 80 MCG/ML (10ML) SYRINGE FOR IV PUSH (FOR BLOOD PRESSURE SUPPORT): 80.0000 ug | PREFILLED_SYRINGE | INTRAVENOUS | Status: DC | PRN

## 2023-02-28 MED ORDER — OXYTOCIN BOLUS FROM INFUSION
333.0000 mL | Freq: Once | INTRAVENOUS | Status: AC
  Administered 2023-03-01: 333 mL via INTRAVENOUS

## 2023-02-28 MED ORDER — SOD CITRATE-CITRIC ACID 500-334 MG/5ML PO SOLN
30.0000 mL | ORAL | Status: DC | PRN
  Administered 2023-03-01: 30 mL via ORAL
  Filled 2023-02-28: qty 30

## 2023-02-28 MED ORDER — LACTATED RINGERS IV SOLN
500.0000 mL | Freq: Once | INTRAVENOUS | Status: AC
  Administered 2023-03-01: 500 mL via INTRAVENOUS

## 2023-02-28 MED ORDER — OXYTOCIN-SODIUM CHLORIDE 30-0.9 UT/500ML-% IV SOLN
2.5000 [IU]/h | INTRAVENOUS | Status: DC
  Administered 2023-03-01: 2.5 [IU]/h via INTRAVENOUS
  Filled 2023-02-28: qty 500

## 2023-02-28 MED ORDER — ONDANSETRON HCL 4 MG/2ML IJ SOLN
4.0000 mg | Freq: Four times a day (QID) | INTRAMUSCULAR | Status: DC | PRN
  Administered 2023-03-01: 4 mg via INTRAVENOUS
  Filled 2023-02-28: qty 2

## 2023-02-28 NOTE — H&P (Signed)
OBSTETRIC ADMISSION HISTORY AND PHYSICAL  Mary Vincent is a 22 y.o. female G3P1011 with IUP at [redacted]w[redacted]d by LMP presenting for elective IOL. She reports +FMs, No LOF, no VB, no blurry vision, headaches or peripheral edema, and RUQ pain.  She plans on bottle feeding. She will discuss birth control at her 6 week post partum visit  She received her prenatal care at [facility]  Dating: By LMP --->  Estimated Date of Delivery: 03/05/23  Sono:    @[redacted]w[redacted]d , CWD, normal anatomy, cephalic presentation, anterior placental lie, 846 g, 34% EFW  NURSING  PROVIDER  [facility] Dating by LMP c/w U/S at   wks  [facility] Model Traditional Anatomy U/S    Initiated care at  [facility]  English               LAB RESULTS   Support Person FOB Genetics NIPS:             AFP:                  NT/IT (FT only)        Carrier Screen Horizon: silent carrier alpha thal  Rhogam  O/Positive/-- (05/22 1138) A1C/GTT Early: 5.1            Third trimester:  Flu Vaccine Declined 10/20/2022      TDaP Vaccine 01/12/23 Blood Type O/Positive/-- (05/22 1138)  Covid Vaccine Received  Antibody Negative (05/22 1138)      Rubella <0.90 (05/22 1138)  Feeding Plan unsure RPR Non Reactive (05/22 1138)  Contraception Yes, undecided  HBsAg Negative (05/22 1138)  Circumcision Yes HIV Non Reactive (05/22 1138)  Pediatrician  [facility] HCVAb Non Reactive (05/22 1138)  Prenatal Classes            Pap       Diagnosis  Date Value Ref Range Status  07/01/2020     Final    - Negative for intraepithelial lesion or malignancy (NILM)    BTLConsent   GC/CT Initial:             36wks:  VBAC  Consent NA GBS For PCN allergy, check sensitivities            DME Rx [ ]  BP cuff [ ]  Weight Scale Waterbirth  [ ]  Class [ ]  Consent [ ]  CNM visit  PHQ9 & GAD7 [x]  new OB [x]  28 weeks  [x]  36 weeks Induction  [ ]  Orders Entered [ ] Foley Y/N     Prenatal History/Complications:  Rubella non-immune status Alpha  Thalassemia silent carrier   Past Medical History: Past Medical History:  Diagnosis Date   Anemia    Chlamydia    Chlamydia contact, treated    Ovarian cyst    Trichomonas infection     Past Surgical History: Past Surgical History:  Procedure Laterality Date   WISDOM TOOTH EXTRACTION      Obstetrical History: OB History     Gravida  3   Para  1   Term  1   Preterm  0   AB  1   Living  1      SAB  0   IAB  1   Ectopic  0   Multiple  0   Live Births  1           Social History Social History  Socioeconomic History   Marital status: Single    Spouse name: Not on file   Number of children: Not on file   Years of education: Not on file   Highest education level: Not on file  Occupational History   Not on file  Tobacco Use   Smoking status: Never   Smokeless tobacco: Never  Vaping Use   Vaping status: Never Used  Substance and Sexual Activity   Alcohol use: Not Currently    Comment: weekends   Drug use: Yes    Types: Marijuana    Comment: last used 3 months ago   Sexual activity: Yes    Birth control/protection: None  Other Topics Concern   Not on file  Social History Narrative   Not on file   Social Determinants of Health   Financial Resource Strain: Not on file  Food Insecurity: No Food Insecurity (02/28/2023)   Hunger Vital Sign    Worried About Running Out of Food in the Last Year: Never true    Ran Out of Food in the Last Year: Never true  Transportation Needs: No Transportation Needs (02/28/2023)   PRAPARE - Administrator, Civil Service (Medical): No    Lack of Transportation (Non-Medical): No  Physical Activity: Not on file  Stress: Not on file  Social Connections: Unknown (08/09/2022)   [facility]   Social Network    Social Network: Not on file    Family History: Family History  Problem Relation Age of Onset   Healthy Mother    Healthy Father    Hypertension Maternal  Grandmother     Allergies: No Known Allergies  Medications Prior to Admission  Medication Sig Dispense Refill Last Dose   Prenatal Vit-Fe Phos-FA-Omega (VITAFOL GUMMIES) 3.33-0.333-34.8 MG CHEW Chew 3 tablets by mouth daily in the afternoon. 90 tablet 11 Past Week   aspirin EC 81 MG tablet Take 1 tablet (81 mg total) by mouth daily. Swallow whole. (Patient not taking: Reported on 02/23/2023) 30 tablet 12    Blood Pressure Monitoring (BLOOD PRESSURE KIT) DEVI 1 each by Does not apply route as needed. (Patient not taking: Reported on 10/27/2022) 1 each 0    ferrous sulfate 325 (65 FE) MG tablet Take 1 tablet (325 mg total) by mouth daily with breakfast. 30 tablet 3 Unknown   Magnesium Oxide -Mg Supplement (MAG-OXIDE) 200 MG TABS Take 2 tablets (400 mg total) by mouth at bedtime. If that amount causes loose stools in the am, switch to 200mg  daily at bedtime. (Patient not taking: Reported on 11/26/2022) 60 tablet 3      Review of Systems   All systems reviewed and negative except as stated in HPI  Blood pressure 107/61, pulse 89, temperature 98.2 F (36.8 C), temperature source Oral, resp. rate 19, height 5\' 5"  (1.651 m), weight 67.5 kg, last menstrual period 05/29/2022. General appearance: alert and no distress Lungs: clear to auscultation bilaterally Heart: regular rate and rhythm Abdomen: soft, non-tender; bowel sounds normal Extremities: no sign of DVT Fetal monitoring Baseline: 135 bpm, Variability: Fair (1-6 bpm), Accelerations: present, and Decelerations: Absent Uterine activity None     Prenatal labs: ABO, Rh: O/Positive/-- (05/22 1138) Antibody: Negative (05/22 1138) Rubella: <0.90 (05/22 1138) RPR: Non Reactive (07/10 1000)  HBsAg: Negative (05/22 1138)  HIV: Non Reactive (07/10 1000)  GBS: Negative/-- (09/05 0808)  1 hr Glucola wnl Genetic screening  wnl Anatomy US wnl  Prenatal Transfer Tool  Maternal Diabetes:  No Genetic Screening: Alpha thal silent carrier.  Partner test declined Maternal Ultrasounds/Referrals: Isolated EIF (echogenic intracardiac focus) Fetal Ultrasounds or other Referrals:  None Maternal Substance Abuse:  No Significant Maternal Medications:  None Significant Maternal Lab Results:  Group B Strep negative Number of Prenatal Visits:greater than 3 verified prenatal visits Other Comments:  None  No results found for this or any previous visit (from the past 24 hour(s)).  Patient Active Problem List   Diagnosis Date Noted   Indication for care in labor or delivery 02/28/2023   Late prenatal care affecting pregnancy 11/22/2022   Alpha thalassemia silent carrier 10/28/2022   Supervision of low-risk pregnancy 10/20/2022   History of COVID-19 01/01/2021   Rubella non-immune status, antepartum 07/03/2020    Assessment/Plan:  Mary Vincent is a 22 y.o. G3P1011 at [redacted]w[redacted]d here for elective IOL  #Labor: discussed with patient on starting Cytotec or Pitocin and patient elected to starting Pitocin  #Pain: Plans on epidural  #FWB: Category 1 #ID:GBS negative  #MOF: bottle #MOC: wants to decide at 6 week post partum visit   Elspeth Cho, Student-PA  02/28/2023, 10:35 PM

## 2023-03-01 ENCOUNTER — Inpatient Hospital Stay (HOSPITAL_COMMUNITY): Payer: Medicaid Other | Admitting: Anesthesiology

## 2023-03-01 ENCOUNTER — Encounter (HOSPITAL_COMMUNITY): Payer: Self-pay | Admitting: Family Medicine

## 2023-03-01 DIAGNOSIS — Z3A39 39 weeks gestation of pregnancy: Secondary | ICD-10-CM | POA: Diagnosis not present

## 2023-03-01 DIAGNOSIS — O4202 Full-term premature rupture of membranes, onset of labor within 24 hours of rupture: Secondary | ICD-10-CM | POA: Diagnosis not present

## 2023-03-01 LAB — RPR: RPR Ser Ql: NONREACTIVE

## 2023-03-01 MED ORDER — SIMETHICONE 80 MG PO CHEW: 80.0000 mg | CHEWABLE_TABLET | ORAL | Status: DC | PRN

## 2023-03-01 MED ORDER — DIPHENHYDRAMINE HCL 25 MG PO CAPS: 25.0000 mg | ORAL_CAPSULE | Freq: Four times a day (QID) | ORAL | Status: DC | PRN

## 2023-03-01 MED ORDER — WITCH HAZEL-GLYCERIN EX PADS: 1.0000 | MEDICATED_PAD | CUTANEOUS | Status: DC | PRN

## 2023-03-01 MED ORDER — SENNOSIDES-DOCUSATE SODIUM 8.6-50 MG PO TABS
2.0000 | ORAL_TABLET | Freq: Every day | ORAL | Status: DC
  Administered 2023-03-02: 2 via ORAL
  Filled 2023-03-01: qty 2

## 2023-03-01 MED ORDER — BENZOCAINE-MENTHOL 20-0.5 % EX AERO
1.0000 | INHALATION_SPRAY | CUTANEOUS | Status: DC | PRN
  Administered 2023-03-01: 1 via TOPICAL
  Filled 2023-03-01: qty 56

## 2023-03-01 MED ORDER — TETANUS-DIPHTH-ACELL PERTUSSIS 5-2.5-18.5 LF-MCG/0.5 IM SUSY: 0.5000 mL | PREFILLED_SYRINGE | Freq: Once | INTRAMUSCULAR | Status: DC

## 2023-03-01 MED ORDER — COCONUT OIL OIL: 1.0000 | TOPICAL_OIL | Status: DC | PRN

## 2023-03-01 MED ORDER — FERROUS SULFATE 325 (65 FE) MG PO TABS
325.0000 mg | ORAL_TABLET | Freq: Every day | ORAL | Status: DC
  Administered 2023-03-02: 325 mg via ORAL
  Filled 2023-03-01: qty 1

## 2023-03-01 MED ORDER — DIBUCAINE (PERIANAL) 1 % EX OINT: 1.0000 | TOPICAL_OINTMENT | CUTANEOUS | Status: DC | PRN

## 2023-03-01 MED ORDER — ZOLPIDEM TARTRATE 5 MG PO TABS: 5.0000 mg | ORAL_TABLET | Freq: Every evening | ORAL | Status: DC | PRN

## 2023-03-01 MED ORDER — ACETAMINOPHEN 325 MG PO TABS: 650.0000 mg | ORAL_TABLET | ORAL | Status: DC | PRN

## 2023-03-01 MED ORDER — ONDANSETRON HCL 4 MG/2ML IJ SOLN: 4.0000 mg | INTRAMUSCULAR | Status: DC | PRN

## 2023-03-01 MED ORDER — LIDOCAINE HCL (PF) 1 % IJ SOLN
INTRAMUSCULAR | Status: DC | PRN
  Administered 2023-03-01 (×2): 4 mL via EPIDURAL

## 2023-03-01 MED ORDER — IBUPROFEN 600 MG PO TABS
600.0000 mg | ORAL_TABLET | Freq: Four times a day (QID) | ORAL | Status: DC
  Administered 2023-03-01 – 2023-03-02 (×4): 600 mg via ORAL
  Filled 2023-03-01 (×3): qty 1

## 2023-03-01 MED ORDER — ONDANSETRON HCL 4 MG PO TABS: 4.0000 mg | ORAL_TABLET | ORAL | Status: DC | PRN

## 2023-03-01 MED ORDER — PRENATAL MULTIVITAMIN CH
1.0000 | ORAL_TABLET | Freq: Every day | ORAL | Status: DC
  Administered 2023-03-02: 1 via ORAL

## 2023-03-01 NOTE — Progress Notes (Signed)
LABOR PROGRESS NOTE  Mary Vincent is a 22 y.o. G3P1011 at [redacted]w[redacted]d presented for eIOL.  S: Feels comfortable after epidural placement. SROM.  O:  BP 95/66   Pulse 90   Temp 98 F (36.7 C) (Oral)   Resp 19   Ht 5\' 5"  (1.651 m)   Wt 67.5 kg   LMP 05/29/2022 (Exact Date)   SpO2 100%   BMI 24.76 kg/m  EFM:120 bpm/Moderate variability/ 15x15 accels/ Variable decels CAT: 2 Toco: regular, every 1-2 minutes   CVE: Dilation: 7 Effacement (%): 80 Cervical Position: Posterior Station: -1 Presentation: Vertex Exam by:: Dr. Earlene Plater   A&P: 22 y.o. [id number] [redacted]w[redacted]d here for eIOL as above  #Labor: Progressing well. Stopped pit for recurrent deep variables and borderline tachysystole. Babe now with earlies, moderate variability, accels. Restart pit after 20 minutes if necessary. Progressing quickly towards delivery #Pain: Epidural #FWB: CAT 2 #GBS negative  Joanne Gavel, MD FMOB Fellow, Faculty practice [facility], Center for [facility] 03/01/23  3:59 AM

## 2023-03-01 NOTE — Anesthesia Preprocedure Evaluation (Signed)
Anesthesia Evaluation  Patient identified by MRN, date of birth, ID band Patient awake    Reviewed: Allergy & Precautions, NPO status , Patient's Chart, lab work & pertinent test results  History of Anesthesia Complications Negative for: history of anesthetic complications  Airway Mallampati: III  TM Distance: >3 FB Neck ROM: Full    Dental   Pulmonary neg pulmonary ROS   Pulmonary exam normal breath sounds clear to auscultation       Cardiovascular negative cardio ROS  Rhythm:Regular Rate:Normal     Neuro/Psych negative neurological ROS     GI/Hepatic negative GI ROS, Neg liver ROS,,,  Endo/Other  negative endocrine ROS    Renal/GU negative Renal ROS     Musculoskeletal   Abdominal   Peds  Hematology  (+) Blood dyscrasia (alpha thalassemia silent carrier), anemia Lab Results      Component                Value               Date                      WBC                      8.0                 02/28/2023                HGB                      8.7 (L)             02/28/2023                HCT                      27.6 (L)            02/28/2023                MCV                      69.3 (L)            02/28/2023                PLT                      212                 02/28/2023              Anesthesia Other Findings   Reproductive/Obstetrics (+) Pregnancy                              Anesthesia Physical Anesthesia Plan  ASA: 2  Anesthesia Plan: Epidural   Post-op Pain Management:    Induction:   PONV Risk Score and Plan:   Airway Management Planned:   Additional Equipment:   Intra-op Plan:   Post-operative Plan:   Informed Consent: I have reviewed the patients History and Physical, chart, labs and discussed the procedure including the risks, benefits and alternatives for the proposed anesthesia with the patient or authorized representative who has indicated  his/her understanding and acceptance.       Plan Discussed with: Anesthesiologist  Anesthesia Plan  Comments: (I have discussed risks of neuraxial anesthesia including but not limited to infection, bleeding, nerve injury, back pain, headache, seizures, and failure of block. Patient denies bleeding disorders and is not currently anticoagulated. Labs have been reviewed. Risks and benefits discussed. All patient's questions answered.  )         Anesthesia Quick Evaluation

## 2023-03-01 NOTE — Discharge Summary (Signed)
Postpartum Discharge Summary  Date of Service updated: 03/02/23     Patient Name: Mary Vincent DOB: 2000-08-26 MRN: [id number]  Date of admission: 02/28/2023 Delivery date:03/01/2023 Delivering provider: Wyn Forster Date of discharge: 03/02/2023  Admitting diagnosis: Indication for care in labor or delivery [O75.9] Intrauterine pregnancy: [redacted]w[redacted]d     Secondary diagnosis:  Principal Problem:   Indication for care in labor or delivery  Additional problems: NA    Discharge diagnosis: Term Pregnancy Delivered                                              Post partum procedures: NA Augmentation: Pitocin Complications: None  Hospital course: Induction of Labor With Vaginal Delivery   22 y.o. yo [id number] at [redacted]w[redacted]d was admitted to the hospital 02/28/2023 for induction of labor.  Indication for induction: Elective.  Patient had an labor course uncomplicated. Membrane Rupture Time/Date: 3:20 AM,03/01/2023  Delivery Method:Vaginal, Spontaneous Operative Delivery:N/A Episiotomy: None Lacerations:  None Details of delivery can be found in separate delivery note.  Patient had a postpartum course was unremarkable. Patient is discharged home 03/02/23.  Newborn Data: Birth date:03/01/2023 Birth time:2:09 PM Gender:Female Living status:Living Apgars:7 ,9  Weight:3070 g  Magnesium Sulfate received: No BMZ received: No Rhophylac:N/A MMR:No T-DaP: Declined Flu: N/A RSV Vaccine received: No Transfusion:No  Immunizations received: Immunization History  Administered Date(s) Administered   HPV Quadrivalent 07/12/2012, 02/06/2013   PFIZER(Purple Top)SARS-COV-2 Vaccination 04/29/2020   PPD Test 03/07/2020   Td 02/17/2010   Tdap 02/17/2010, 10/23/2020, 01/12/2023    Physical exam  Vitals:   03/01/23 1645 03/01/23 2046 03/02/23 0027 03/02/23 0538  BP: 103/66 100/77 106/76 107/73  Pulse: 70 60 75 83  Resp: 17 16 16 16   Temp: 98 F (36.7 C) 97.8 F (36.6 C) 98 F (36.7 C) 98.2 F  (36.8 C)  TempSrc: Oral Oral Oral Oral  SpO2: 100% 100% 100% 100%  Weight:      Height:       General: alert Lochia: appropriate Uterine Fundus: firm Incision: N/A DVT Evaluation: No evidence of DVT seen on physical exam. Labs: Lab Results  Component Value Date   WBC 12.1 (H) 03/02/2023   HGB 8.3 (L) 03/02/2023   HCT 26.6 (L) 03/02/2023   MCV 69.5 (L) 03/02/2023   PLT 194 03/02/2023       No data to display         Edinburgh Score:    03/02/2023    5:38 AM  Edinburgh Postnatal Depression Scale Screening Tool  I have been able to laugh and see the funny side of things. 0  I have looked forward with enjoyment to things. 0  I have blamed myself unnecessarily when things went wrong. 0  I have been anxious or worried for no good reason. 0  I have felt scared or panicky for no good reason. 0  Things have been getting on top of me. 0  I have been so unhappy that I have had difficulty sleeping. 0  I have felt sad or miserable. 0  I have been so unhappy that I have been crying. 0  The thought of harming myself has occurred to me. 0  Edinburgh Postnatal Depression Scale Total 0   Edinburgh Postnatal Depression Scale Total: 0   After visit meds:  Allergies as of 03/02/2023   No Known Allergies  Medication List     STOP taking these medications    aspirin EC 81 MG tablet   Blood Pressure Kit Devi   Mag-Oxide 200 MG Tabs Generic drug: Magnesium Oxide -Mg Supplement       TAKE these medications    ferrous sulfate 325 (65 FE) MG tablet Take 1 tablet (325 mg total) by mouth daily with breakfast.   ibuprofen 600 MG tablet Commonly known as: ADVIL Take 1 tablet (600 mg total) by mouth every 6 (six) hours.   Vitafol Gummies 3.33-0.333-34.8 MG Chew Chew 3 tablets by mouth daily in the afternoon.         Discharge home in stable condition Infant Feeding: Bottle Infant Disposition:home with mother Discharge instruction: per After Visit Summary and  Postpartum booklet. Activity: Advance as tolerated. Pelvic rest for 6 weeks.  Diet: routine diet Future Appointments:No future appointments. Follow up Visit:  Follow-up Information     [facility] Follow up.   Specialty: Obstetrics and Gynecology Contact information: [address] [place] [address] [phone]               Message sent to [facility] on 9/24  Please schedule this patient for a In person postpartum visit in 6 weeks with the following provider: Any provider. Additional Postpartum F/U:Postpartum Depression checkup and Anemia   Low risk pregnancy complicated by:  anemia Delivery mode:  Vaginal, Spontaneous Anticipated Birth Control:  Unsure   03/02/2023 Hermina Staggers, MD

## 2023-03-01 NOTE — Progress Notes (Signed)
Labor Progress Note Ciin Obryan is a 22 y.o. G3P1011 at [redacted]w[redacted]d presented for eIOL S: Coping well, comfortable with epidural  O:  BP 117/84   Pulse 97   Temp 97.9 F (36.6 C) (Oral)   Resp 19   Ht 5\' 5"  (1.651 m)   Wt 67.5 kg   LMP 05/29/2022 (Exact Date)   SpO2 100%   BMI 24.76 kg/m  EFM: 115/moderate variability/accels present/no decels  CVE: Dilation: 8 Effacement (%): 80 Cervical Position: Posterior Station: -1 Presentation: Vertex Exam by:: Chelsea RN   A&P: 22 y.o. [id number] [redacted]w[redacted]d admitted for eIOL #Labor: Progressing slowly since reaching 8cm, suspect acynclitic presentation. Discussed facilitating positioning changes and restarting pitocin 2x2 #Pain: well controlled with epidural #FWB: Cat I #GBS negative   Wyn Forster, MD 12:59 PM

## 2023-03-01 NOTE — Anesthesia Procedure Notes (Signed)
Epidural Patient location during procedure: OB Start time: 03/01/2023 2:34 AM End time: 03/01/2023 2:39 AM  Staffing Anesthesiologist: Linton Rump, MD Performed: anesthesiologist   Preanesthetic Checklist Completed: patient identified, IV checked, site marked, risks and benefits discussed, surgical consent, monitors and equipment checked, pre-op evaluation and timeout performed  Epidural Patient position: sitting Prep: DuraPrep and site prepped and draped Patient monitoring: continuous pulse ox and blood pressure Approach: midline Location: L3-L4 Injection technique: LOR saline  Needle:  Needle type: Tuohy  Needle gauge: 17 G Needle length: 9 cm and 9 Needle insertion depth: 4 cm Catheter type: closed end flexible Catheter size: 19 Gauge Catheter at skin depth: 8 cm Test dose: negative  Assessment Events: blood not aspirated, no cerebrospinal fluid, injection not painful, no injection resistance, no paresthesia and negative IV test  Additional Notes The patient has requested an epidural for labor pain management. Risks and benefits including, but not limited to, infection, bleeding, local anesthetic toxicity, headache, hypotension, back pain, block failure, etc. were discussed with the patient. The patient expressed understanding and consented to the procedure. I confirmed that the patient has no bleeding disorders and is not taking blood thinners. I confirmed the patient's last platelet count with the nurse. A time-out was performed immediately prior to the procedure. Please see nursing documentation for vital signs. Sterile technique was used throughout the whole procedure. Once LOR achieved, the epidural catheter threaded easily without resistance. Aspiration of the catheter was negative for blood and CSF. The epidural was dosed slowly and an infusion was started.  1 attempt(s)Reason for block:procedure for pain

## 2023-03-02 ENCOUNTER — Encounter: Payer: Medicaid Other | Admitting: Certified Nurse Midwife

## 2023-03-02 LAB — CBC
HCT: 26.6 % — ABNORMAL LOW (ref 36.0–46.0)
Hemoglobin: 8.3 g/dL — ABNORMAL LOW (ref 12.0–15.0)
MCH: 21.7 pg — ABNORMAL LOW (ref 26.0–34.0)
MCHC: 31.2 g/dL (ref 30.0–36.0)
MCV: 69.5 fL — ABNORMAL LOW (ref 80.0–100.0)
Platelets: 194 10*3/uL (ref 150–400)
RBC: 3.83 MIL/uL — ABNORMAL LOW (ref 3.87–5.11)
RDW: 16.6 % — ABNORMAL HIGH (ref 11.5–15.5)
WBC: 12.1 10*3/uL — ABNORMAL HIGH (ref 4.0–10.5)
nRBC: 0 % (ref 0.0–0.2)

## 2023-03-02 MED ORDER — IBUPROFEN 600 MG PO TABS: 600.0000 mg | ORAL_TABLET | Freq: Four times a day (QID) | ORAL | 0 refills | Status: DC

## 2023-03-02 NOTE — Anesthesia Postprocedure Evaluation (Signed)
Anesthesia Post Note  Patient: Art gallery manager  Procedure(s) Performed: AN AD HOC LABOR EPIDURAL     Patient location during evaluation: Mother Baby Anesthesia Type: Epidural Level of consciousness: awake and alert and oriented Pain management: satisfactory to patient Vital Signs Assessment: post-procedure vital signs reviewed and stable Respiratory status: respiratory function stable Cardiovascular status: stable Postop Assessment: no headache, no backache, epidural receding, patient able to bend at knees, no signs of nausea or vomiting and adequate PO intake Anesthetic complications: no   No notable events documented.  Last Vitals:  Vitals:   03/02/23 0027 03/02/23 0538  BP: 106/76 107/73  Pulse: 75 83  Resp: 16 16  Temp: 36.7 C 36.8 C  SpO2: 100% 100%    Last Pain:  Vitals:   03/02/23 0538  TempSrc: Oral  PainSc: 3    Pain Goal:                   Pauletta Pickney

## 2023-03-02 NOTE — Plan of Care (Signed)

## 2023-03-02 NOTE — Plan of Care (Signed)
Problem: Education: Goal: Knowledge of General Education information will improve Description: Including pain rating scale, medication(s)/side effects and non-pharmacologic comfort measures 03/02/2023 1155 by Donne Hazel, LPN Outcome: Adequate for Discharge 03/02/2023 0750 by Donne Hazel, LPN Outcome: Progressing   Problem: Health Behavior/Discharge Planning: Goal: Ability to manage health-related needs will improve 03/02/2023 1155 by Donne Hazel, LPN Outcome: Adequate for Discharge 03/02/2023 0750 by Donne Hazel, LPN Outcome: Progressing   Problem: Clinical Measurements: Goal: Ability to maintain clinical measurements within normal limits will improve 03/02/2023 1155 by Donne Hazel, LPN Outcome: Adequate for Discharge 03/02/2023 0750 by Donne Hazel, LPN Outcome: Progressing Goal: Will remain free from infection 03/02/2023 1155 by Donne Hazel, LPN Outcome: Adequate for Discharge 03/02/2023 0750 by Donne Hazel, LPN Outcome: Progressing Goal: Diagnostic test results will improve 03/02/2023 1155 by Donne Hazel, LPN Outcome: Adequate for Discharge 03/02/2023 0750 by Donne Hazel, LPN Outcome: Progressing Goal: Respiratory complications will improve 03/02/2023 1155 by Donne Hazel, LPN Outcome: Adequate for Discharge 03/02/2023 0750 by Donne Hazel, LPN Outcome: Progressing Goal: Cardiovascular complication will be avoided 03/02/2023 1155 by Donne Hazel, LPN Outcome: Adequate for Discharge 03/02/2023 0750 by Donne Hazel, LPN Outcome: Progressing   Problem: Activity: Goal: Risk for activity intolerance will decrease 03/02/2023 1155 by Donne Hazel, LPN Outcome: Adequate for Discharge 03/02/2023 0750 by Donne Hazel, LPN Outcome: Progressing   Problem: Nutrition: Goal: Adequate nutrition will be maintained 03/02/2023 1155 by Donne Hazel, LPN Outcome: Adequate for Discharge 03/02/2023 0750 by Donne Hazel, LPN Outcome: Progressing    Problem: Coping: Goal: Level of anxiety will decrease 03/02/2023 1155 by Donne Hazel, LPN Outcome: Adequate for Discharge 03/02/2023 0750 by Donne Hazel, LPN Outcome: Progressing   Problem: Elimination: Goal: Will not experience complications related to bowel motility 03/02/2023 1155 by Donne Hazel, LPN Outcome: Adequate for Discharge 03/02/2023 0750 by Donne Hazel, LPN Outcome: Progressing Goal: Will not experience complications related to urinary retention 03/02/2023 1155 by Donne Hazel, LPN Outcome: Adequate for Discharge 03/02/2023 0750 by Donne Hazel, LPN Outcome: Progressing   Problem: Pain Managment: Goal: General experience of comfort will improve 03/02/2023 1155 by Donne Hazel, LPN Outcome: Adequate for Discharge 03/02/2023 0750 by Donne Hazel, LPN Outcome: Progressing   Problem: Safety: Goal: Ability to remain free from injury will improve 03/02/2023 1155 by Donne Hazel, LPN Outcome: Adequate for Discharge 03/02/2023 0750 by Donne Hazel, LPN Outcome: Progressing   Problem: Skin Integrity: Goal: Risk for impaired skin integrity will decrease 03/02/2023 1155 by Donne Hazel, LPN Outcome: Adequate for Discharge 03/02/2023 0750 by Donne Hazel, LPN Outcome: Progressing   Problem: Education: Goal: Knowledge of condition will improve 03/02/2023 1155 by Donne Hazel, LPN Outcome: Adequate for Discharge 03/02/2023 0750 by Donne Hazel, LPN Outcome: Progressing Goal: Individualized Educational Video(s) 03/02/2023 1155 by Donne Hazel, LPN Outcome: Adequate for Discharge 03/02/2023 0750 by Donne Hazel, LPN Outcome: Progressing Goal: Individualized Newborn Educational Video(s) 03/02/2023 1155 by Donne Hazel, LPN Outcome: Adequate for Discharge 03/02/2023 0750 by Donne Hazel, LPN Outcome: Progressing   Problem: Activity: Goal: Will verbalize the importance of balancing activity with adequate rest periods 03/02/2023 1155 by Donne Hazel, LPN Outcome: Adequate for Discharge 03/02/2023 0750 by Donne Hazel, LPN Outcome: Progressing Goal: Ability to tolerate increased activity will improve 03/02/2023 1155 by Donne Hazel, LPN Outcome: Adequate for  Discharge 03/02/2023 0750 by Donne Hazel, LPN Outcome: Progressing   Problem: Coping: Goal: Ability to identify and utilize available resources and services will improve 03/02/2023 1155 by Donne Hazel, LPN Outcome: Adequate for Discharge 03/02/2023 0750 by Donne Hazel, LPN Outcome: Progressing   Problem: Life Cycle: Goal: Chance of risk for complications during the postpartum period will decrease 03/02/2023 1155 by Donne Hazel, LPN Outcome: Adequate for Discharge 03/02/2023 0750 by Donne Hazel, LPN Outcome: Progressing   Problem: Role Relationship: Goal: Ability to demonstrate positive interaction with newborn will improve 03/02/2023 1155 by Donne Hazel, LPN Outcome: Adequate for Discharge 03/02/2023 0750 by Donne Hazel, LPN Outcome: Progressing   Problem: Skin Integrity: Goal: Demonstration of wound healing without infection will improve 03/02/2023 1155 by Donne Hazel, LPN Outcome: Adequate for Discharge 03/02/2023 0750 by Donne Hazel, LPN Outcome: Progressing

## 2023-03-02 NOTE — Discharge Instructions (Signed)
WHAT TO LOOK OUT FOR: Fever of 100.4 or above Mastitis: feels like flu and breasts hurt Infection: increased pain, swelling or redness Blood clots golf ball size or larger Postpartum depression   Congratulations on your newest addition!

## 2023-03-09 ENCOUNTER — Encounter: Payer: Self-pay | Admitting: Certified Nurse Midwife

## 2023-03-12 ENCOUNTER — Inpatient Hospital Stay (HOSPITAL_COMMUNITY): Payer: Medicaid Other

## 2023-03-28 ENCOUNTER — Telehealth (HOSPITAL_COMMUNITY): Payer: Self-pay | Admitting: *Deleted

## 2023-03-28 NOTE — Telephone Encounter (Signed)
03/28/2023  Name: Mary Vincent MRN: [id number] DOB: 02/11/2001  Reason for Call:  Transition of Care Hospital Discharge Call  Contact Status: Patient Contact Status: Complete  Language assistant needed: Interpreter Mode: Interpreter Not Needed        Follow-Up Questions: Do You Have Any Concerns About Your Health As You Heal From Delivery?: No Do You Have Any Concerns About Your Infants Health?: Yes What Concerns Do You Have About Your Baby?: Baby is spitting up after most feedings and patient wonders whether she should change baby's formula.  Says baby takes 2-3 oz/ feeding, burps, and still often spits up.  Next pediatrician appointment is Oct 24.  Encouraged patient to keep that appointment and discuss feeding concerns with pediatrician.  Edinburgh Postnatal Depression Scale:  In the Past 7 Days: I have been able to laugh and see the funny side of things.: As much as I always could I have looked forward with enjoyment to things.: Rather less than I used to I have blamed myself unnecessarily when things went wrong.: No, never I have been anxious or worried for no good reason.: No, not at all I have felt scared or panicky for no good reason.: No, not at all Things have been getting on top of me.: No, I have been coping as well as ever I have been so unhappy that I have had difficulty sleeping.: Not at all I have felt sad or miserable.: Not very often I have been so unhappy that I have been crying.: No, never The thought of harming myself has occurred to me.: Never Edinburgh Postnatal Depression Scale Total: 2  PHQ2-9 Depression Scale:     Discharge Follow-up: Edinburgh score requires follow up?: No Patient was advised of the following resources:: Support Group  Post-discharge interventions: Reviewed Newborn Safe Sleep Practices  Salena Saner, RN 03/28/2023 16:35

## 2023-04-05 ENCOUNTER — Ambulatory Visit: Payer: Medicaid Other | Admitting: Student

## 2023-09-13 ENCOUNTER — Emergency Department (HOSPITAL_COMMUNITY)

## 2023-09-13 ENCOUNTER — Other Ambulatory Visit: Payer: Self-pay

## 2023-09-13 ENCOUNTER — Emergency Department (HOSPITAL_COMMUNITY)
Admission: EM | Admit: 2023-09-13 | Discharge: 2023-09-13 | Disposition: A | Discharge status: Home / Self Care | Attending: Emergency Medicine | Admitting: Emergency Medicine

## 2023-09-13 ENCOUNTER — Encounter (HOSPITAL_COMMUNITY): Payer: Self-pay

## 2023-09-13 DIAGNOSIS — Z8616 Personal history of COVID-19: Secondary | ICD-10-CM | POA: Diagnosis not present

## 2023-09-13 DIAGNOSIS — Z3402 Encounter for supervision of normal first pregnancy, second trimester: Secondary | ICD-10-CM

## 2023-09-13 DIAGNOSIS — Y92410 Unspecified street and highway as the place of occurrence of the external cause: Secondary | ICD-10-CM | POA: Insufficient documentation

## 2023-09-13 DIAGNOSIS — Z23 Encounter for immunization: Secondary | ICD-10-CM | POA: Insufficient documentation

## 2023-09-13 DIAGNOSIS — S82832A Other fracture of upper and lower end of left fibula, initial encounter for closed fracture: Secondary | ICD-10-CM | POA: Diagnosis not present

## 2023-09-13 DIAGNOSIS — S99912A Unspecified injury of left ankle, initial encounter: Secondary | ICD-10-CM | POA: Diagnosis present

## 2023-09-13 LAB — I-STAT CHEM 8, ED
BUN: 15 mg/dL (ref 6–20)
Calcium, Ion: 1.12 mmol/L — ABNORMAL LOW (ref 1.15–1.40)
Chloride: 103 mmol/L (ref 98–111)
Creatinine, Ser: 0.9 mg/dL (ref 0.44–1.00)
Glucose, Bld: 102 mg/dL — ABNORMAL HIGH (ref 70–99)
HCT: 33 % — ABNORMAL LOW (ref 36.0–46.0)
Hemoglobin: 11.2 g/dL — ABNORMAL LOW (ref 12.0–15.0)
Potassium: 4.3 mmol/L (ref 3.5–5.1)
Sodium: 135 mmol/L (ref 135–145)
TCO2: 22 mmol/L (ref 22–32)

## 2023-09-13 LAB — I-STAT CG4 LACTIC ACID, ED: Lactic Acid, Venous: 2.1 mmol/L (ref 0.5–1.9)

## 2023-09-13 LAB — COMPREHENSIVE METABOLIC PANEL WITH GFR
ALT: 15 U/L (ref 0–44)
AST: 22 U/L (ref 15–41)
Albumin: 3.7 g/dL (ref 3.5–5.0)
Alkaline Phosphatase: 40 U/L (ref 38–126)
Anion gap: 9 (ref 5–15)
BUN: 12 mg/dL (ref 6–20)
CO2: 20 mmol/L — ABNORMAL LOW (ref 22–32)
Calcium: 8.8 mg/dL — ABNORMAL LOW (ref 8.9–10.3)
Chloride: 105 mmol/L (ref 98–111)
Creatinine, Ser: 0.84 mg/dL (ref 0.44–1.00)
GFR, Estimated: >60 mL/min (ref >60)
Glucose, Bld: 104 mg/dL — ABNORMAL HIGH (ref 70–99)
Potassium: 4.3 mmol/L (ref 3.5–5.1)
Sodium: 134 mmol/L — ABNORMAL LOW (ref 135–145)
Total Bilirubin: 0.4 mg/dL (ref 0.0–1.2)
Total Protein: 6.7 g/dL (ref 6.5–8.1)

## 2023-09-13 LAB — SAMPLE TO BLOOD BANK

## 2023-09-13 LAB — CBC
HCT: 32 % — ABNORMAL LOW (ref 36.0–46.0)
Hemoglobin: 10 g/dL — ABNORMAL LOW (ref 12.0–15.0)
MCH: 21.9 pg — ABNORMAL LOW (ref 26.0–34.0)
MCHC: 31.3 g/dL (ref 30.0–36.0)
MCV: 70.2 fL — ABNORMAL LOW (ref 80.0–100.0)
Platelets: 216 10*3/uL (ref 150–400)
RBC: 4.56 MIL/uL (ref 3.87–5.11)
RDW: 17.8 % — ABNORMAL HIGH (ref 11.5–15.5)
WBC: 5.8 10*3/uL (ref 4.0–10.5)
nRBC: 0 % (ref 0.0–0.2)

## 2023-09-13 LAB — PROTIME-INR
INR: 1 (ref 0.8–1.2)
Prothrombin Time: 12.9 s (ref 11.4–15.2)

## 2023-09-13 LAB — HCG, QUANTITATIVE, PREGNANCY: hCG, Beta Chain, Quant, S: 21948 m[IU]/mL — ABNORMAL HIGH (ref <5)

## 2023-09-13 MED ORDER — TETANUS-DIPHTH-ACELL PERTUSSIS 5-2.5-18.5 LF-MCG/0.5 IM SUSY
0.5000 mL | PREFILLED_SYRINGE | Freq: Once | INTRAMUSCULAR | Status: AC
  Administered 2023-09-13: 0.5 mL via INTRAMUSCULAR
  Filled 2023-09-13: qty 0.5

## 2023-09-13 MED ORDER — ACETAMINOPHEN 500 MG PO TABS
1000.0000 mg | ORAL_TABLET | Freq: Once | ORAL | Status: AC
  Administered 2023-09-13: 1000 mg via ORAL
  Filled 2023-09-13: qty 2

## 2023-09-13 MED ORDER — OXYCODONE HCL 5 MG PO TABS: 5.0000 mg | ORAL_TABLET | Freq: Four times a day (QID) | ORAL | 0 refills | Status: DC | PRN

## 2023-09-13 MED ORDER — VITAFOL GUMMIES 3.33-0.333-34.8 MG PO CHEW: 3.0000 | CHEWABLE_TABLET | Freq: Every day | ORAL | 11 refills | Status: AC

## 2023-09-13 MED ORDER — SODIUM CHLORIDE 0.9 % IV BOLUS
1000.0000 mL | Freq: Once | INTRAVENOUS | Status: AC
  Administered 2023-09-13: 1000 mL via INTRAVENOUS

## 2023-09-13 NOTE — Progress Notes (Signed)
 Orthopedic Tech Progress Note Patient Details:  Mary Vincent 10/12/2000 [id number]  Ortho Devices Type of Ortho Device: CAM walker Ortho Device/Splint Location: LLE Ortho Device/Splint Interventions: Ordered, Application, Adjustment   Post Interventions Patient Tolerated: Well Instructions Provided: Care of device  Donald Pore 09/13/2023, 6:13 PM
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
## 2023-09-13 NOTE — ED Notes (Signed)
Pt able to ambulate to the bathroom without assistance.  

## 2023-09-13 NOTE — Discharge Instructions (Addendum)
 We evaluated you for your injuries.  Your x-ray shows a fracture of your distal fibula, bone in your ankle.  We have given you a walking boot.  This type of injury does not usually require surgery, but please follow-up with Dr. Christell Constant with orthopedic surgery so that they can make sure it is healing well.  Please take it as milligrams of Tylenol every 6 hours as needed for pain.  You can also take oxycodone.  We have prescribed a small amount of oxycodone.  You can take 5 mg of oxycodone every 6 hours for pain uncontrolled by Tylenol.  Do not take this medication with alcohol or drive while taking this medicine as it may make you drowsy.  Your pregnancy test in the emergency department was positive.  Please follow-up with your OB/GYN.  Please start taking a prenatal vitamin.  You did have some wounds which we cleaned out and dressed in the emergency department.  You can wash these with soap and water and apply regular bandages.  We updated your tetanus.  Please return to the emergency department if you develop any other symptoms such as abdominal pain, vaginal bleeding, chest pain, shortness of breath, lightheadedness or dizziness, fainting, severe headaches, numbness or tingling, weakness, or any other new symptoms.

## 2023-09-13 NOTE — ED Notes (Signed)
 Lactic acid result tagged results given to [facility] w. By at
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
## 2023-10-18 ENCOUNTER — Telehealth

## 2023-10-18 DIAGNOSIS — Z3A11 11 weeks gestation of pregnancy: Secondary | ICD-10-CM

## 2023-10-18 DIAGNOSIS — Z3481 Encounter for supervision of other normal pregnancy, first trimester: Secondary | ICD-10-CM

## 2023-10-18 DIAGNOSIS — Z3687 Encounter for antenatal screening for uncertain dates: Secondary | ICD-10-CM

## 2023-10-18 DIAGNOSIS — Z3480 Encounter for supervision of other normal pregnancy, unspecified trimester: Secondary | ICD-10-CM | POA: Insufficient documentation

## 2023-10-18 MED ORDER — GOJJI WEIGHT SCALE MISC: 1.0000 | 0 refills | Status: DC | PRN

## 2023-10-18 MED ORDER — BLOOD PRESSURE KIT DEVI: 1.0000 | 0 refills | Status: AC | PRN

## 2023-10-18 NOTE — Progress Notes (Signed)
 New OB Intake  I connected with Benny Braver  on 10/18/23 at  9:15 AM EDT by MyChart Video Visit and verified that I am speaking with the correct person using two identifiers. Nurse is located at [facility] and pt is located at home.  I discussed the limitations, risks, security and privacy concerns of performing an evaluation and management service by telephone and the availability of in person appointments. I also discussed with the patient that there may be a patient responsible charge related to this service. The patient expressed understanding and agreed to proceed.  I explained I am completing New OB Intake today. We discussed EDD of Not found.. Pt is [id number]. I reviewed her allergies, medications and Medical/Surgical/OB history.    Patient Active Problem List   Diagnosis Date Noted   Supervision of other normal pregnancy, antepartum 10/18/2023   Alpha thalassemia silent carrier 10/28/2022   Rubella non-immune status, antepartum 07/03/2020     Concerns addressed today  Delivery Plans Plans to deliver at [facility]. Discussed the nature of our practice with multiple providers including residents and students. Due to the size of the practice, the delivering provider may not be the same as those providing prenatal care.   Patient is interested in water  birth.  MyChart/Babyscripts MyChart access verified. I explained pt will have some visits in office and some virtually. Babyscripts instructions given and order placed. Patient verifies receipt of registration text/e-mail. Account successfully created and app downloaded. If patient is a candidate for Optimized scheduling, add to sticky note.   Blood Pressure Cuff/Weight Scale Blood pressure cuff ordered for patient to pick-up from [employer]. Explained after first prenatal appt pt will check weekly and document in Babyscripts. Patient does have weight scale.  Anatomy US  Unsure of LMP. Dating ultrasound is scheduled on 10/19/2023.    Is patient a CenteringPregnancy candidate?  Declined Declined due to Declined to say    Is patient a Mom+Baby Combined Care candidate?  Not a candidate   If accepted, confirm patient does not intend to move from the area for at least 12 months, then notify Mom+Baby staff  Is patient a candidate for Babyscripts Optimization? Yes, patient declined   First visit review I reviewed new OB appt with patient. Explained pt will be seen by Jan Mcgill MD at first visit. Discussed [facility] [place] genetic screening with patient. Needs panorama Routine prenatal labs is not   Last Pap Diagnosis  Date Value Ref Range Status  07/01/2020   Final   - Negative for intraepithelial lesion or malignancy (NILM)    Clarence Croak, CMA 10/18/2023  9:26 AM

## 2023-10-19 ENCOUNTER — Other Ambulatory Visit

## 2023-10-25 ENCOUNTER — Other Ambulatory Visit

## 2023-10-25 ENCOUNTER — Encounter: Admitting: Obstetrics and Gynecology

## 2023-10-30 ENCOUNTER — Ambulatory Visit
Admission: EM | Admit: 2023-10-30 | Discharge: 2023-10-30 | Disposition: A | Discharge status: Home / Self Care | Attending: Family Medicine | Admitting: Family Medicine

## 2023-10-30 DIAGNOSIS — O99612 Diseases of the digestive system complicating pregnancy, second trimester: Secondary | ICD-10-CM | POA: Insufficient documentation

## 2023-10-30 DIAGNOSIS — Z3492 Encounter for supervision of normal pregnancy, unspecified, second trimester: Secondary | ICD-10-CM

## 2023-10-30 DIAGNOSIS — K5900 Constipation, unspecified: Secondary | ICD-10-CM | POA: Diagnosis not present

## 2023-10-30 DIAGNOSIS — Z3A00 Weeks of gestation of pregnancy not specified: Secondary | ICD-10-CM | POA: Diagnosis not present

## 2023-10-30 DIAGNOSIS — N760 Acute vaginitis: Secondary | ICD-10-CM

## 2023-10-30 DIAGNOSIS — O23592 Infection of other part of genital tract in pregnancy, second trimester: Secondary | ICD-10-CM | POA: Insufficient documentation

## 2023-10-30 MED ORDER — CLOTRIMAZOLE 1 % VA CREA: 1.0000 | TOPICAL_CREAM | Freq: Every day | VAGINAL | 0 refills | Status: DC

## 2023-10-30 NOTE — ED Triage Notes (Signed)
 Patient reports she is currently pregnant, she is not sure how far along she is at this time.  Patient reports vaginal itching and discharge x 3 days.Home Intervention: Monistat one day treatment.

## 2023-10-30 NOTE — Discharge Instructions (Signed)
 For moderate to severe constipation (not having a bowel movement in more than 3 days) then try to use Miralax  once daily until you have a good bowel movement.  It is not a good idea to use an enema or laxatives daily. If you find you are doing this, then please follow up with a gastroenterologist. Otherwise, a medication you could use daily to help with promoting bowel movements is docusate (Colace) 100mg . It is okay to use this 1-2 times daily as a stool softener.  Try to stay active physically including regular exercise 2-3 times a week.  Make sure you hydrate well every day with about 64 ounces of water  daily (that is 2 liters).  Try to avoid carb heavy foods, dairy. This includes cutting out breads, pasta, pizza, pastries, potatoes, rice, starchy foods in general.

## 2023-10-30 NOTE — ED Provider Notes (Signed)
 [facility]  Note:  This document was prepared using Conservation officer, historic buildings and may include unintentional dictation errors.  MRN: [id number] DOB: June 24, 2000  Subjective:   Mary Vincent is a 23 y.o. female in second trimester pregnancy presenting for 3-day history of vaginal itching and discharge.  Has used Monistat. Denies fever, n/v, abdominal pain, pelvic pain, rashes, dysuria, urinary frequency, hematuria.  Patient is requesting empiric treatment.  She would also like recommendations for constipation.  No current facility-administered medications for this encounter.  Current Outpatient Medications:    Blood Pressure Monitoring (BLOOD PRESSURE KIT) DEVI, 1 Device by Does not apply route as needed., Disp: 1 each, Rfl: 0   ferrous sulfate  325 (65 FE) MG tablet, Take 1 tablet (325 mg total) by mouth daily with breakfast. (Patient not taking: Reported on 10/18/2023), Disp: 30 tablet, Rfl: 3   ibuprofen  (ADVIL ) 600 MG tablet, Take 1 tablet (600 mg total) by mouth every 6 (six) hours. (Patient not taking: Reported on 10/18/2023), Disp: 30 tablet, Rfl: 0   Misc. Devices (GOJJI WEIGHT SCALE) MISC, 1 Device by Does not apply route as needed., Disp: 1 each, Rfl: 0   oxyCODONE  (ROXICODONE ) 5 MG immediate release tablet, Take 1 tablet (5 mg total) by mouth every 6 (six) hours as needed for severe pain (pain score 7-10). (Patient not taking: Reported on 10/18/2023), Disp: 8 tablet, Rfl: 0   Prenatal Vit-Fe Phos-FA-Omega (VITAFOL  GUMMIES) 3.33-0.333-34.8 MG CHEW, Chew 3 tablets by mouth daily in the afternoon. (Patient not taking: Reported on 10/18/2023), Disp: 90 tablet, Rfl: 11   No Known Allergies  Past Medical History:  Diagnosis Date   Anemia    Chlamydia    Chlamydia contact, treated    Ovarian cyst    Trichomonas infection      Past Surgical History:  Procedure Laterality Date   WISDOM TOOTH EXTRACTION      Family History  Problem Relation Age of  Onset   Healthy Mother    Healthy Father    Hypertension Maternal Grandmother     Social History   Tobacco Use   Smoking status: Never   Smokeless tobacco: Never  Vaping Use   Vaping status: Never Used  Substance Use Topics   Alcohol use: Not Currently    Comment: weekends   Drug use: Not Currently    Types: Marijuana    Comment: last used 3 months ago    ROS   Objective:   Vitals: BP 96/61 (BP Location: Left Arm)   Pulse (!) 59   Temp 98.5 F (36.9 C) (Oral)   Resp 16   LMP  (LMP Unknown)   SpO2 98%   Physical Exam Constitutional:      General: She is not in acute distress.    Appearance: Normal appearance. She is well-developed. She is not ill-appearing, toxic-appearing or diaphoretic.  HENT:     Head: Normocephalic and atraumatic.     Nose: Nose normal.     Mouth/Throat:     Mouth: Mucous membranes are moist.  Eyes:     General: No scleral icterus.       Right eye: No discharge.        Left eye: No discharge.     Extraocular Movements: Extraocular movements intact.  Cardiovascular:     Rate and Rhythm: Normal rate.  Pulmonary:     Effort: Pulmonary effort is normal.  Skin:    General: Skin is warm and dry.  Neurological:  General: No focal deficit present.     Mental Status: She is alert and oriented to person, place, and time.  Psychiatric:        Mood and Affect: Mood normal.        Behavior: Behavior normal.     Assessment and Plan :   PDMP not reviewed this encounter.  1. Acute vaginitis   2. Second trimester pregnancy   3. Constipation, unspecified constipation type    Patient requested empiric treatment for acute vaginitis.  I was agreeable to using clotrimazole topically.  Constipation management reviewed with patient and recommendations provided.  Counseled patient on potential for adverse effects with medications prescribed/recommended today, ER and return-to-clinic precautions discussed, patient verbalized understanding.     Adolph Hoop, [place] 10/30/23 1610

## 2023-11-01 LAB — CERVICOVAGINAL ANCILLARY ONLY
Bacterial Vaginitis (gardnerella): NEGATIVE
Candida Glabrata: POSITIVE — AB
Candida Vaginitis: POSITIVE — AB
Chlamydia: NEGATIVE
Comment: NEGATIVE
Comment: NEGATIVE
Comment: NEGATIVE
Comment: NEGATIVE
Comment: NEGATIVE
Comment: NORMAL
Neisseria Gonorrhea: NEGATIVE
Trichomonas: NEGATIVE

## 2023-11-02 ENCOUNTER — Ambulatory Visit (HOSPITAL_COMMUNITY): Payer: Self-pay

## 2023-11-08 ENCOUNTER — Other Ambulatory Visit: Payer: Self-pay

## 2023-11-08 ENCOUNTER — Ambulatory Visit

## 2023-11-08 DIAGNOSIS — Z3687 Encounter for antenatal screening for uncertain dates: Secondary | ICD-10-CM | POA: Diagnosis not present

## 2023-11-08 DIAGNOSIS — Z3A14 14 weeks gestation of pregnancy: Secondary | ICD-10-CM

## 2023-11-16 ENCOUNTER — Encounter: Admitting: Family Medicine

## 2023-12-04 ENCOUNTER — Encounter (HOSPITAL_BASED_OUTPATIENT_CLINIC_OR_DEPARTMENT_OTHER): Payer: Self-pay | Admitting: Emergency Medicine

## 2023-12-04 ENCOUNTER — Emergency Department (HOSPITAL_BASED_OUTPATIENT_CLINIC_OR_DEPARTMENT_OTHER)
Admission: EM | Admit: 2023-12-04 | Discharge: 2023-12-04 | Disposition: A | Discharge status: Home / Self Care | Attending: Emergency Medicine | Admitting: Emergency Medicine

## 2023-12-04 DIAGNOSIS — S0502XA Injury of conjunctiva and corneal abrasion without foreign body, left eye, initial encounter: Secondary | ICD-10-CM | POA: Insufficient documentation

## 2023-12-04 DIAGNOSIS — Y92810 Car as the place of occurrence of the external cause: Secondary | ICD-10-CM | POA: Diagnosis not present

## 2023-12-04 DIAGNOSIS — W25XXXA Contact with sharp glass, initial encounter: Secondary | ICD-10-CM | POA: Diagnosis not present

## 2023-12-04 DIAGNOSIS — H5712 Ocular pain, left eye: Secondary | ICD-10-CM | POA: Diagnosis present

## 2023-12-04 MED ORDER — TETRACAINE HCL 0.5 % OP SOLN
2.0000 [drp] | Freq: Once | OPHTHALMIC | Status: AC
  Administered 2023-12-04: 2 [drp] via OPHTHALMIC
  Filled 2023-12-04: qty 4

## 2023-12-04 MED ORDER — FLUORESCEIN SODIUM 1 MG OP STRP
1.0000 | ORAL_STRIP | Freq: Once | OPHTHALMIC | Status: AC
  Administered 2023-12-04: 1 via OPHTHALMIC
  Filled 2023-12-04: qty 1

## 2023-12-04 MED ORDER — OFLOXACIN 0.3 % OP SOLN: 1.0000 [drp] | Freq: Four times a day (QID) | OPHTHALMIC | 0 refills | Status: DC

## 2023-12-04 NOTE — Discharge Instructions (Addendum)
 Call to make a close follow-up appointment with the ophthalmologist as discussed.  Return to the emergency room if you have any worsening symptoms.
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
## 2023-12-04 NOTE — ED Notes (Signed)
 Dc instructions reviewed with patient. Patient voiced understanding. Dc with belongings.

## 2023-12-04 NOTE — ED Notes (Signed)
Supplies at bedside.
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
## 2023-12-07 ENCOUNTER — Other Ambulatory Visit: Payer: Self-pay

## 2023-12-07 ENCOUNTER — Ambulatory Visit (INDEPENDENT_AMBULATORY_CARE_PROVIDER_SITE_OTHER): Admitting: Obstetrics and Gynecology

## 2023-12-07 ENCOUNTER — Encounter: Payer: Self-pay | Admitting: Obstetrics and Gynecology

## 2023-12-07 ENCOUNTER — Other Ambulatory Visit (HOSPITAL_COMMUNITY)
Admission: RE | Admit: 2023-12-07 | Discharge: 2023-12-07 | Disposition: A | Discharge status: Home / Self Care | Source: Ambulatory Visit | Attending: Obstetrics and Gynecology | Admitting: Obstetrics and Gynecology

## 2023-12-07 VITALS — BP 97/62 | HR 60 | Wt 124.8 lb

## 2023-12-07 DIAGNOSIS — Z2839 Other underimmunization status: Secondary | ICD-10-CM

## 2023-12-07 DIAGNOSIS — Z3A18 18 weeks gestation of pregnancy: Secondary | ICD-10-CM | POA: Diagnosis not present

## 2023-12-07 DIAGNOSIS — Z3482 Encounter for supervision of other normal pregnancy, second trimester: Secondary | ICD-10-CM

## 2023-12-07 DIAGNOSIS — Z1332 Encounter for screening for maternal depression: Secondary | ICD-10-CM

## 2023-12-07 DIAGNOSIS — Z3480 Encounter for supervision of other normal pregnancy, unspecified trimester: Secondary | ICD-10-CM | POA: Diagnosis present

## 2023-12-07 DIAGNOSIS — D563 Thalassemia minor: Secondary | ICD-10-CM

## 2023-12-07 DIAGNOSIS — O09892 Supervision of other high risk pregnancies, second trimester: Secondary | ICD-10-CM | POA: Diagnosis not present

## 2023-12-07 DIAGNOSIS — O09899 Supervision of other high risk pregnancies, unspecified trimester: Secondary | ICD-10-CM

## 2023-12-07 NOTE — Progress Notes (Signed)
 Anatomy US  scheduled for July 30th at 0800.  Pt notified.   Terrion Poblano,RN

## 2023-12-07 NOTE — Progress Notes (Signed)
 INITIAL PRENATAL VISIT NOTE  Subjective:  Mary Vincent is a 23 y.o. H5E7987 at [redacted]w[redacted]d by ultrasound being seen today for her initial prenatal visit.  She has an obstetric history significant for SVD x 2. She has a medical history significant for hx of chlamydia and trich in the past.  Patient reports no complaints.  Contractions: Not present. Vag. Bleeding: None.  Movement: Absent. Denies leaking of fluid.    Past Medical History:  Diagnosis Date   Anemia    Chlamydia    Chlamydia contact, treated    Ovarian cyst    Trichomonas infection     Past Surgical History:  Procedure Laterality Date   WISDOM TOOTH EXTRACTION      OB History  Gravida Para Term Preterm AB Living  4 2 2  0 1 2  SAB IAB Ectopic Multiple Live Births  0 1 0 0 2    # Outcome Date GA Lbr Len/2nd Weight Sex Type Anes PTL Lv  4 Current           3 Term 03/01/23 [redacted]w[redacted]d 14:34 / 00:18 6 lb 12.3 oz (3.07 kg) F Vag-Spont EPI  LIV  2 IAB 2023          1 Term 01/01/21 [redacted]w[redacted]d  6 lb 1.9 oz (2.775 kg) F Vag-Spont EPI  LIV     Birth Comments: C52    Social History   Socioeconomic History   Marital status: Single    Spouse name: Not on file   Number of children: Not on file   Years of education: Not on file   Highest education level: Not on file  Occupational History   Not on file  Tobacco Use   Smoking status: Never   Smokeless tobacco: Never  Vaping Use   Vaping status: Never Used  Substance and Sexual Activity   Alcohol use: Not Currently    Comment: weekends   Drug use: Not Currently    Types: Marijuana    Comment: last used 3 months ago   Sexual activity: Yes    Birth control/protection: None  Other Topics Concern   Not on file  Social History Narrative   Not on file   Social Drivers of Health   Financial Resource Strain: Not on file  Food Insecurity: No Food Insecurity (02/28/2023)   Hunger Vital Sign    Worried About Running Out of Food in the Last Year: Never true    Ran Out of  Food in the Last Year: Never true  Transportation Needs: No Transportation Needs (02/28/2023)   PRAPARE - Administrator, Civil Service (Medical): No    Lack of Transportation (Non-Medical): No  Physical Activity: Not on file  Stress: Not on file  Social Connections: Unknown (08/09/2022)   Received from [facility]   Social Network    Social Network: Not on file    Family History  Problem Relation Age of Onset   Healthy Mother    Healthy Father    Hypertension Maternal Grandmother      Current Outpatient Medications:    Blood Pressure Monitoring (BLOOD PRESSURE KIT) DEVI, 1 Device by Does not apply route as needed. (Patient not taking: Reported on 12/07/2023), Disp: 1 each, Rfl: 0   Misc. Devices (GOJJI WEIGHT SCALE) MISC, 1 Device by Does not apply route as needed., Disp: 1 each, Rfl: 0   ofloxacin (OCUFLOX) 0.3 % ophthalmic solution, Place 1 drop into the left eye 4 (four) times daily., Disp:  5 mL, Rfl: 0   Prenatal Vit-Fe Phos-FA-Omega (VITAFOL  GUMMIES) 3.33-0.333-34.8 MG CHEW, Chew 3 tablets by mouth daily in the afternoon. (Patient not taking: Reported on 10/18/2023), Disp: 90 tablet, Rfl: 11  No Known Allergies  Review of Systems: Negative except for what is mentioned in HPI.  Objective:   Vitals:   12/07/23 1007  BP: 97/62  Pulse: 60  Weight: 124 lb 12.8 oz (56.6 kg)    Fetal Status: Fetal Heart Rate (bpm): 154 Fundal Height: 18 cm Movement: Absent     Physical Exam: BP 97/62   Pulse 60   Wt 124 lb 12.8 oz (56.6 kg)   LMP  (LMP Unknown)   BMI 20.77 kg/m  CONSTITUTIONAL: Well-developed, well-nourished female in no acute distress.  NEUROLOGIC: Alert and oriented to person, place, and time. Normal reflexes, muscle tone coordination. No cranial nerve deficit noted. PSYCHIATRIC: Normal mood and affect. Normal behavior. Normal judgment and thought content. SKIN: Skin is warm and dry. No rash noted. Not diaphoretic. No erythema. No pallor. HENT:   Normocephalic, atraumatic, External right and left ear normal. Oropharynx is clear and moist EYES: Conjunctivae and EOM are normal.  NECK: Normal range of motion, supple, no masses CARDIOVASCULAR: Normal heart rate noted, regular rhythm RESPIRATORY: Effort and breath sounds normal, no problems with respiration noted BREASTS: deferred ABDOMEN: Soft, nontender, nondistended, gravid. GU: normal appearing external female genitalia, multiparous, normal appearing cervix, scant white discharge in vagina, no lesions noted, pap taken Bimanual: 18 weeks sized uterus, no adnexal tenderness or palpable lesions noted MUSCULOSKELETAL: Normal range of motion. EXT:  No edema and no tenderness. 2+ distal pulses.   Assessment and Plan:  Pregnancy: [id number] at [redacted]w[redacted]d by ultrasound  1. Alpha thalassemia silent carrier (Primary)  - US  MFM OB DETAIL +14 WK; Future  2. Supervision of other normal pregnancy, antepartum Continue routine prenatal care  - US  MFM OB DETAIL +14 WK; Future - PANORAMA PRENATAL TEST - [facility] Basic Panel - Cytology - PAP( [place]) - Culture, OB Urine - CBC/D/Plt+RPR+Rh+ABO+RubIgG... - Hemoglobin A1c - Cervicovaginal ancillary only( [place])  3. [redacted] weeks gestation of pregnancy     Preterm labor symptoms and general obstetric precautions including but not limited to vaginal bleeding, contractions, leaking of fluid and fetal movement were reviewed in detail with the patient.  Please refer to After Visit Summary for other counseling recommendations.   Return in about 4 weeks (around 01/04/2024) for ROB, in person.  Jerilynn DELENA Buddle 12/07/2023 10:57 AM

## 2023-12-08 LAB — CYTOLOGY - PAP
Comment: NEGATIVE
Diagnosis: NEGATIVE
High risk HPV: NEGATIVE

## 2023-12-08 LAB — CBC/D/PLT+RPR+RH+ABO+RUBIGG...
Antibody Screen: NEGATIVE
Basophils Absolute: 0 10*3/uL (ref 0.0–0.2)
Basos: 1 %
EOS (ABSOLUTE): 0.1 10*3/uL (ref 0.0–0.4)
Eos: 2 %
HCV Ab: NONREACTIVE
HIV Screen 4th Generation wRfx: NONREACTIVE
Hematocrit: 32.3 % — ABNORMAL LOW (ref 34.0–46.6)
Hemoglobin: 9.6 g/dL — ABNORMAL LOW (ref 11.1–15.9)
Hepatitis B Surface Ag: NEGATIVE
Immature Grans (Abs): 0 10*3/uL (ref 0.0–0.1)
Immature Granulocytes: 0 %
Lymphocytes Absolute: 1 10*3/uL (ref 0.7–3.1)
Lymphs: 24 %
MCH: 23 pg — ABNORMAL LOW (ref 26.6–33.0)
MCHC: 29.7 g/dL — ABNORMAL LOW (ref 31.5–35.7)
MCV: 77 fL — ABNORMAL LOW (ref 79–97)
Monocytes Absolute: 0.3 10*3/uL (ref 0.1–0.9)
Monocytes: 8 %
Neutrophils Absolute: 2.9 10*3/uL (ref 1.4–7.0)
Neutrophils: 65 %
Platelets: 211 10*3/uL (ref 150–450)
RBC: 4.18 x10E6/uL (ref 3.77–5.28)
RDW: 19.9 % — ABNORMAL HIGH (ref 11.7–15.4)
RPR Ser Ql: NONREACTIVE
Rh Factor: POSITIVE
Rubella Antibodies, IGG: <0.9 {index} — ABNORMAL LOW (ref >0.99)
WBC: 4.3 10*3/uL (ref 3.4–10.8)

## 2023-12-08 LAB — HEMOGLOBIN A1C
Est. average glucose Bld gHb Est-mCnc: 100 mg/dL
Hgb A1c MFr Bld: 5.1 % (ref 4.8–5.6)

## 2023-12-08 LAB — CERVICOVAGINAL ANCILLARY ONLY
Bacterial Vaginitis (gardnerella): NEGATIVE
Candida Glabrata: NEGATIVE
Candida Vaginitis: NEGATIVE
Chlamydia: NEGATIVE
Comment: NEGATIVE
Comment: NEGATIVE
Comment: NEGATIVE
Comment: NEGATIVE
Comment: NEGATIVE
Comment: NORMAL
Neisseria Gonorrhea: NEGATIVE
Trichomonas: NEGATIVE

## 2023-12-08 LAB — HCV INTERPRETATION

## 2023-12-12 ENCOUNTER — Ambulatory Visit: Payer: Self-pay | Admitting: Obstetrics and Gynecology

## 2023-12-12 DIAGNOSIS — Z3480 Encounter for supervision of other normal pregnancy, unspecified trimester: Secondary | ICD-10-CM

## 2023-12-13 LAB — PANORAMA PRENATAL TEST FULL PANEL:PANORAMA TEST PLUS 5 ADDITIONAL MICRODELETIONS: FETAL FRACTION: 18.3

## 2023-12-23 LAB — HORIZON CUSTOM: REPORT SUMMARY: POSITIVE — AB

## 2023-12-29 NOTE — Telephone Encounter (Signed)
-----   Message from Jerilynn DELENA Buddle sent at 12/27/2023  9:23 AM EDT ----- [facility], silent carrier alpha thal, can offer genetic counseling and/or FOB testing ----- Message ----- From: Interface, [facility] Lab Results In Sent: 12/08/2023   8:37 AM EDT To: Jerilynn DELENA Buddle, MD

## 2023-12-29 NOTE — Telephone Encounter (Signed)
 I called Mary Vincent and informed her of results and recommendations for partner testing and genetic counseling. She voices understanding and said this is not first pregnancy and she already knew about this. Rock Skip PEAK

## 2024-01-04 ENCOUNTER — Other Ambulatory Visit: Payer: Self-pay | Admitting: *Deleted

## 2024-01-04 ENCOUNTER — Ambulatory Visit: Attending: Obstetrics and Gynecology | Admitting: Obstetrics and Gynecology

## 2024-01-04 ENCOUNTER — Other Ambulatory Visit: Payer: Self-pay | Admitting: Obstetrics and Gynecology

## 2024-01-04 ENCOUNTER — Ambulatory Visit

## 2024-01-04 VITALS — BP 98/61 | HR 76

## 2024-01-04 DIAGNOSIS — Z363 Encounter for antenatal screening for malformations: Secondary | ICD-10-CM | POA: Insufficient documentation

## 2024-01-04 DIAGNOSIS — Z3480 Encounter for supervision of other normal pregnancy, unspecified trimester: Secondary | ICD-10-CM

## 2024-01-04 DIAGNOSIS — Z148 Genetic carrier of other disease: Secondary | ICD-10-CM | POA: Diagnosis not present

## 2024-01-04 DIAGNOSIS — O365920 Maternal care for other known or suspected poor fetal growth, second trimester, not applicable or unspecified: Secondary | ICD-10-CM | POA: Diagnosis not present

## 2024-01-04 DIAGNOSIS — Z3A22 22 weeks gestation of pregnancy: Secondary | ICD-10-CM | POA: Insufficient documentation

## 2024-01-04 DIAGNOSIS — D563 Thalassemia minor: Secondary | ICD-10-CM

## 2024-01-04 DIAGNOSIS — O365990 Maternal care for other known or suspected poor fetal growth, unspecified trimester, not applicable or unspecified: Secondary | ICD-10-CM

## 2024-01-04 NOTE — Progress Notes (Signed)
 Maternal-Fetal Medicine Consultation Name: Mary Vincent MRN: [id number]  G4 E7987 at 22w 4d gestation.  Patient is here for fetal anatomy scan.   On cell-free fetal DNA screening, the risks of fetal aneuploidies are not increased. Obstetric history is significant for 2 term vaginal deliveries.  Both her children weighed over 6 pounds.  Her most recent delivery was in September 2024.  Both her children are in good health.   Ultrasound We performed a fetal anatomical survey.  Amniotic fluid is normal good fetal activity seen.  Fetal biometry lags established gestational age by 8 days. Fetal anatomical survey appears normal.  No markers of aneuploidies or obvious fetal structural defects are seen.  The estimated fetal weight is at the 9th percentile and the abdominal circumference measurement at the 22nd percentile. Umbilical artery Doppler was performed because of suspected fetal growth restriction.  Umbilical artery Doppler showed normal forward diastolic flow.  Suspected fetal growth restriction I explained the finding of lagging biometry and potential fetal growth restriction.  Definitive diagnosis will be made in [redacted] weeks gestation after next fetal growth assessment.  Small for gestational age is usually constitutional with no evidence of fetal growth restriction.  Most likely cause of fetal growth restriction is placental insufficiency.  Short Interpregnancy Interval It is defined as the time interval (18 to 24 months) between the end of previous pregnancy and the beginning of next pregnancy. The impact of short pregnancy interval on the outcome of subsequent pregnancy is uncertain. Some studies have shown that congenital anomalies, preterm delivery, and fetal growth restriction rates are increased in pregnancies with short interpregnancy interval.  However, it is not supported by other reports. Overall, we should expect good pregnancy outcomes if there are no other high-risk  factors.  Recommendations - An appointment was made for her to return in 4 weeks for fetal growth assessment.    Consultation including face-to-face (more than 50%) counseling 30 minutes.

## 2024-01-06 ENCOUNTER — Ambulatory Visit: Admitting: Obstetrics and Gynecology

## 2024-01-06 ENCOUNTER — Other Ambulatory Visit: Payer: Self-pay

## 2024-01-06 VITALS — BP 107/66 | HR 57 | Wt 129.9 lb

## 2024-01-06 DIAGNOSIS — O99112 Other diseases of the blood and blood-forming organs and certain disorders involving the immune mechanism complicating pregnancy, second trimester: Secondary | ICD-10-CM | POA: Diagnosis not present

## 2024-01-06 DIAGNOSIS — Z3A22 22 weeks gestation of pregnancy: Secondary | ICD-10-CM

## 2024-01-06 DIAGNOSIS — O09892 Supervision of other high risk pregnancies, second trimester: Secondary | ICD-10-CM | POA: Diagnosis not present

## 2024-01-06 DIAGNOSIS — O2242 Hemorrhoids in pregnancy, second trimester: Secondary | ICD-10-CM | POA: Diagnosis not present

## 2024-01-06 DIAGNOSIS — Z3480 Encounter for supervision of other normal pregnancy, unspecified trimester: Secondary | ICD-10-CM

## 2024-01-06 DIAGNOSIS — O09899 Supervision of other high risk pregnancies, unspecified trimester: Secondary | ICD-10-CM

## 2024-01-06 DIAGNOSIS — Z2839 Other underimmunization status: Secondary | ICD-10-CM

## 2024-01-06 DIAGNOSIS — D563 Thalassemia minor: Secondary | ICD-10-CM | POA: Diagnosis not present

## 2024-01-06 MED ORDER — DOCUSATE SODIUM 100 MG PO CAPS: 100.0000 mg | ORAL_CAPSULE | Freq: Two times a day (BID) | ORAL | 2 refills | Status: DC | PRN

## 2024-01-06 NOTE — Progress Notes (Signed)
   PRENATAL VISIT NOTE  Subjective:  Mary Vincent is a 23 y.o. H5E7987 at [redacted]w[redacted]d being seen today for ongoing prenatal care.  She is currently monitored for the following issues for this high-risk pregnancy and has Rubella non-immune status, antepartum; Alpha thalassemia silent carrier; and Supervision of other normal pregnancy, antepartum on their problem list.  Patient doing well with no acute concerns today. She reports no complaints.   . Vag. Bleeding: None.  Movement: Present. Denies leaking of fluid.   The following portions of the patient's history were reviewed and updated as appropriate: allergies, current medications, past family history, past medical history, past social history, past surgical history and problem list. Problem list updated.  Objective:   Vitals:   01/06/24 1110  BP: 107/66  Pulse: (!) 57  Weight: 129 lb 14.4 oz (58.9 kg)    Fetal Status: Fetal Heart Rate (bpm): 147 Fundal Height: 20 cm Movement: Present     General:  Alert, oriented and cooperative. Patient is in no acute distress.  Skin: Skin is warm and dry. No rash noted.   Cardiovascular: Normal heart rate noted  Respiratory: Normal respiratory effort, no problems with respiration noted  Abdomen: Soft, gravid, appropriate for gestational age.  Pain/Pressure: Absent     Pelvic: Cervical exam deferred        Extremities: Normal range of motion.  Edema: None  Mental Status:  Normal mood and affect. Normal behavior. Normal judgment and thought content.   Assessment and Plan:  Pregnancy: H5E7987 at [redacted]w[redacted]d  1. Supervision of other normal pregnancy, antepartum (Primary) Continue routine prenatal care Fetus is currently SGA, FGR may be confirmed with next growth scan at the end of august - AFP, Serum, Open Spina Bifida  2. [redacted] weeks gestation of pregnancy   3. Rubella non-immune status, antepartum Treat after delivery  4. Alpha thalassemia silent carrier   5. Hemorrhoids during pregnancy in  second trimester Pt advised to increase hydration and start stool softener, per pt there is no obvious hemorrhoid currently - docusate sodium  (COLACE) 100 MG capsule; Take 1 capsule (100 mg total) by mouth 2 (two) times daily as needed.  Dispense: 30 capsule; Refill: 2  Preterm labor symptoms and general obstetric precautions including but not limited to vaginal bleeding, contractions, leaking of fluid and fetal movement were reviewed in detail with the patient.  Please refer to After Visit Summary for other counseling recommendations.   Return in about 4 weeks (around 02/03/2024) for ROB, in person, 2 hr GTT, 3rd trim labs.   Jerilynn Buddle, MD Faculty Attending Center for [facility]

## 2024-01-08 LAB — AFP, SERUM, OPEN SPINA BIFIDA
AFP MoM: 0.88
AFP Value: 78.7 ng/mL
Gest. Age on Collection Date: 22 wk
Maternal Age At EDD: 25.5 a
OSBR Risk 1 IN: 10000
Test Results:: NEGATIVE
Weight: 130 [lb_av]

## 2024-01-09 ENCOUNTER — Ambulatory Visit: Payer: Self-pay | Admitting: Obstetrics and Gynecology

## 2024-01-09 DIAGNOSIS — Z3480 Encounter for supervision of other normal pregnancy, unspecified trimester: Secondary | ICD-10-CM

## 2024-01-31 ENCOUNTER — Ambulatory Visit (HOSPITAL_BASED_OUTPATIENT_CLINIC_OR_DEPARTMENT_OTHER)

## 2024-01-31 ENCOUNTER — Ambulatory Visit: Attending: Obstetrics and Gynecology | Admitting: Obstetrics and Gynecology

## 2024-01-31 ENCOUNTER — Other Ambulatory Visit: Payer: Self-pay | Admitting: Obstetrics and Gynecology

## 2024-01-31 ENCOUNTER — Other Ambulatory Visit: Payer: Self-pay | Admitting: *Deleted

## 2024-01-31 VITALS — BP 102/58

## 2024-01-31 DIAGNOSIS — Z3A26 26 weeks gestation of pregnancy: Secondary | ICD-10-CM | POA: Diagnosis not present

## 2024-01-31 DIAGNOSIS — O99012 Anemia complicating pregnancy, second trimester: Secondary | ICD-10-CM

## 2024-01-31 DIAGNOSIS — Z362 Encounter for other antenatal screening follow-up: Secondary | ICD-10-CM | POA: Diagnosis present

## 2024-01-31 DIAGNOSIS — O365990 Maternal care for other known or suspected poor fetal growth, unspecified trimester, not applicable or unspecified: Secondary | ICD-10-CM

## 2024-01-31 DIAGNOSIS — O365920 Maternal care for other known or suspected poor fetal growth, second trimester, not applicable or unspecified: Secondary | ICD-10-CM

## 2024-01-31 DIAGNOSIS — Z148 Genetic carrier of other disease: Secondary | ICD-10-CM | POA: Diagnosis not present

## 2024-01-31 DIAGNOSIS — O09892 Supervision of other high risk pregnancies, second trimester: Secondary | ICD-10-CM

## 2024-01-31 DIAGNOSIS — D563 Thalassemia minor: Secondary | ICD-10-CM

## 2024-01-31 DIAGNOSIS — Z3480 Encounter for supervision of other normal pregnancy, unspecified trimester: Secondary | ICD-10-CM

## 2024-01-31 NOTE — Progress Notes (Signed)
 Maternal-Fetal Medicine Consultation Name: Mary Vincent MRN: [id number]  G4 E7987 at 26w 3d gestation.  Patient is here for fetal growth assessment.  Fetal growth restriction was suspected at her previous ultrasound.  Short interpregnancy interval. Her previous children weighed over 6 pounds at birth.  Ultrasound On today's ultrasound, the estimated fetal weight is at the 5th percentile and the abdominal circumference measurement at the 12th percentile.  Amniotic fluid is normal and good fetal activity seen.  Umbilical artery Doppler showed normal for diastolic flow.  No obvious fetal structural defects are seen.  Fetal growth restriction I explained the finding of fetal growth restriction that is difficult to differentiate from a constitutionally small for gestational age fetus. However, it persists in this follow-up scan.  I discussed the possible causes of fetal growth restriction including placental insufficiency (most common cause), chromosomal anomalies and fetal infections.  Patient did not give history of fever or rashes.  I explained that only amniocentesis will give a definitive result on the fetal karyotype and some genetic conditions (Microarray).  I explained amniocentesis procedure and possible complication of preterm delivery (1 and 500 procedures). I discussed ultrasound protocol of monitoring fetal growth restriction. Timing of delivery: We will discuss timing of delivery later in third trimester.  Her blood pressure today at our office is 102/58 mmHg.  Recommendations - An appointment was made for her to return in 2 weeks for umbilical artery Doppler study in 3 weeks for fetal growth assessment.   Consultation including face-to-face (more than 50%) counseling 20 minutes.

## 2024-02-01 ENCOUNTER — Ambulatory Visit

## 2024-02-07 ENCOUNTER — Other Ambulatory Visit

## 2024-02-07 ENCOUNTER — Other Ambulatory Visit: Payer: Self-pay

## 2024-02-07 ENCOUNTER — Encounter: Admitting: Obstetrics and Gynecology

## 2024-02-07 DIAGNOSIS — Z3480 Encounter for supervision of other normal pregnancy, unspecified trimester: Secondary | ICD-10-CM

## 2024-02-07 DIAGNOSIS — Z3A27 27 weeks gestation of pregnancy: Secondary | ICD-10-CM

## 2024-02-08 ENCOUNTER — Other Ambulatory Visit

## 2024-02-08 ENCOUNTER — Other Ambulatory Visit: Payer: Self-pay

## 2024-02-08 DIAGNOSIS — Z3480 Encounter for supervision of other normal pregnancy, unspecified trimester: Secondary | ICD-10-CM

## 2024-02-08 DIAGNOSIS — Z3A27 27 weeks gestation of pregnancy: Secondary | ICD-10-CM

## 2024-02-09 ENCOUNTER — Ambulatory Visit: Payer: Self-pay | Admitting: Obstetrics and Gynecology

## 2024-02-09 ENCOUNTER — Encounter

## 2024-02-09 LAB — CBC
Hematocrit: 30.1 % — ABNORMAL LOW (ref 34.0–46.6)
Hemoglobin: 9.4 g/dL — ABNORMAL LOW (ref 11.1–15.9)
MCH: 25.1 pg — ABNORMAL LOW (ref 26.6–33.0)
MCHC: 31.2 g/dL — ABNORMAL LOW (ref 31.5–35.7)
MCV: 80 fL (ref 79–97)
Platelets: 170 x10E3/uL (ref 150–450)
RBC: 3.75 x10E6/uL — ABNORMAL LOW (ref 3.77–5.28)
RDW: 14.7 % (ref 11.7–15.4)
WBC: 5.6 x10E3/uL (ref 3.4–10.8)

## 2024-02-09 LAB — RPR: RPR Ser Ql: NONREACTIVE

## 2024-02-09 LAB — GLUCOSE TOLERANCE, 2 HOURS W/ 1HR
Glucose, 1 hour: 105 mg/dL (ref 70–179)
Glucose, 2 hour: 69 mg/dL — ABNORMAL LOW (ref 70–152)
Glucose, Fasting: 67 mg/dL — ABNORMAL LOW (ref 70–91)

## 2024-02-09 LAB — HIV ANTIBODY (ROUTINE TESTING W REFLEX): HIV Screen 4th Generation wRfx: NONREACTIVE

## 2024-02-14 ENCOUNTER — Ambulatory Visit: Attending: Obstetrics and Gynecology

## 2024-02-14 ENCOUNTER — Ambulatory Visit (HOSPITAL_BASED_OUTPATIENT_CLINIC_OR_DEPARTMENT_OTHER): Admitting: Maternal & Fetal Medicine

## 2024-02-14 ENCOUNTER — Other Ambulatory Visit: Payer: Self-pay | Admitting: *Deleted

## 2024-02-14 VITALS — BP 99/68

## 2024-02-14 DIAGNOSIS — O365930 Maternal care for other known or suspected poor fetal growth, third trimester, not applicable or unspecified: Secondary | ICD-10-CM

## 2024-02-14 DIAGNOSIS — D563 Thalassemia minor: Secondary | ICD-10-CM

## 2024-02-14 DIAGNOSIS — O365920 Maternal care for other known or suspected poor fetal growth, second trimester, not applicable or unspecified: Secondary | ICD-10-CM | POA: Insufficient documentation

## 2024-02-14 DIAGNOSIS — D561 Beta thalassemia: Secondary | ICD-10-CM | POA: Diagnosis not present

## 2024-02-14 DIAGNOSIS — Z3689 Encounter for other specified antenatal screening: Secondary | ICD-10-CM | POA: Insufficient documentation

## 2024-02-14 DIAGNOSIS — O99013 Anemia complicating pregnancy, third trimester: Secondary | ICD-10-CM

## 2024-02-14 DIAGNOSIS — Z3A28 28 weeks gestation of pregnancy: Secondary | ICD-10-CM

## 2024-02-14 DIAGNOSIS — Z3480 Encounter for supervision of other normal pregnancy, unspecified trimester: Secondary | ICD-10-CM | POA: Insufficient documentation

## 2024-02-14 DIAGNOSIS — Z148 Genetic carrier of other disease: Secondary | ICD-10-CM | POA: Diagnosis not present

## 2024-02-14 DIAGNOSIS — D560 Alpha thalassemia: Secondary | ICD-10-CM | POA: Insufficient documentation

## 2024-02-14 DIAGNOSIS — O09892 Supervision of other high risk pregnancies, second trimester: Secondary | ICD-10-CM | POA: Insufficient documentation

## 2024-02-14 NOTE — Progress Notes (Signed)
 Patient information  Patient Name: Mary Vincent  Patient MRN:   [id number]  Referring practice: MFM Referring Provider: [facility])  Problem List   Patient Active Problem List   Diagnosis Date Noted   Supervision of other normal pregnancy, antepartum 10/18/2023   Alpha thalassemia silent carrier 10/28/2022   Rubella non-immune status, antepartum 07/03/2020    Maternal Fetal medicine Consult  Mary Vincent is a 23 y.o. H5E7987 at [redacted]w[redacted]d here for ultrasound and consultation. Elta Belleau is doing well today with no acute concerns. Today we focused on the following:   FGR: Prior EFW was 4.5%. The UA dopplers are within normal limits.  The patient herself is a small person only weighing barely above 100 pounds when not pregnant.  I discussed this is likely constitutional but further growth ultrasounds are recommended to assess the trajectory of the estimated fetal weight.  We discussed the difference tween pathologic growth restriction and small for gestational age and the clinical implications of both. The patient had time to ask questions that were answered to her satisfaction.  She verbalized understanding and agrees to proceed with the plan below.  The patient did not have time to stay for a nonstress test so she will come back tomorrow and have this done.  She reports good fetal movement.  Sonographic findings Single intrauterine pregnancy at 28w 3d. Fetal cardiac activity:  Observed and appears normal. Presentation: Cephalic. Interval fetal anatomy appears normal. Amniotic fluid: Within normal limits.  MVP: 4.27 cm. Placenta: Posterior. Umbilical artery dopplers findings: -S/D:3.05 which are normal at this gestational age.  -Absent end-diastolic flow: No.  -Reversed end-diastolic flow:  No. Good fetal movement and tone.   There are limitations of prenatal ultrasound such as the inability to detect certain abnormalities due to poor  visualization. Various factors such as fetal position, gestational age and maternal body habitus may increase the difficulty in visualizing the fetal anatomy.    Recommendations - NST tomorrow since the patient didn't have time today. - Weekly UA dopplers and antenatal testing until delivery. - Serial growth US  every 3 weeks until delivery. - Delivery likely around 38 weeks or sooner if indicated.   Review of Systems: A review of systems was performed and was negative except per HPI   Vitals and Physical Exam    02/14/2024    1:17 PM 01/31/2024   11:37 AM 01/06/2024   11:10 AM  Vitals with BMI  Weight   129 lbs 14 oz  Systolic 99 102 107  Diastolic 68 58 66  Pulse   57    Sitting comfortably on the sonogram table Nonlabored breathing Normal rate and rhythm Abdomen is nontender  Past pregnancies OB History  Gravida Para Term Preterm AB Living  4 2 2  0 1 2  SAB IAB Ectopic Multiple Live Births  0 1 0 0 2    # Outcome Date GA Lbr Len/2nd Weight Sex Type Anes PTL Lv  4 Current           3 Term 03/01/23 [redacted]w[redacted]d 14:34 / 00:18 6 lb 12.3 oz (3.07 kg) F Vag-Spont EPI  LIV  2 IAB 2023          1 Term 01/01/21 [redacted]w[redacted]d  6 lb 1.9 oz (2.775 kg) F Vag-Spont EPI  LIV     Birth Comments: [id number]     I spent 30 minutes reviewing the patients chart, including labs and images as well as counseling the patient  about her medical conditions. Greater than 50% of the time was spent in direct face-to-face patient counseling.  Delora Smaller  MFM, [place]   02/14/2024  2:11 PM

## 2024-02-15 ENCOUNTER — Other Ambulatory Visit

## 2024-02-20 ENCOUNTER — Ambulatory Visit (INDEPENDENT_AMBULATORY_CARE_PROVIDER_SITE_OTHER): Admitting: Family Medicine

## 2024-02-20 ENCOUNTER — Other Ambulatory Visit: Payer: Self-pay

## 2024-02-20 VITALS — BP 117/65 | HR 99 | Wt 131.0 lb

## 2024-02-20 DIAGNOSIS — Z3480 Encounter for supervision of other normal pregnancy, unspecified trimester: Secondary | ICD-10-CM

## 2024-02-20 DIAGNOSIS — D563 Thalassemia minor: Secondary | ICD-10-CM

## 2024-02-20 DIAGNOSIS — Z3A29 29 weeks gestation of pregnancy: Secondary | ICD-10-CM

## 2024-02-20 DIAGNOSIS — O09893 Supervision of other high risk pregnancies, third trimester: Secondary | ICD-10-CM

## 2024-02-20 DIAGNOSIS — O99013 Anemia complicating pregnancy, third trimester: Secondary | ICD-10-CM

## 2024-02-20 DIAGNOSIS — Z2839 Other underimmunization status: Secondary | ICD-10-CM

## 2024-02-20 MED ORDER — FERROUS SULFATE 325 (65 FE) MG PO TBEC
325.0000 mg | DELAYED_RELEASE_TABLET | ORAL | 3 refills | Status: DC
Start: 2024-02-20 — End: 2024-03-28

## 2024-02-20 NOTE — Progress Notes (Signed)
   PRENATAL VISIT NOTE  Subjective:  Mary Vincent is a 23 y.o. H5E7987 at [redacted]w[redacted]d being seen today for ongoing prenatal care.  She is currently monitored for the following issues for this low-risk pregnancy and has Rubella non-immune status, antepartum; Alpha thalassemia silent carrier; and Supervision of other normal pregnancy, antepartum on their problem list.  Patient reports no complaints.  Contractions: Not present. Vag. Bleeding: None.  Movement: Present. Denies leaking of fluid.   The following portions of the patient's history were reviewed and updated as appropriate: allergies, current medications, past family history, past medical history, past social history, past surgical history and problem list.   Objective:    Vitals:   02/20/24 1021  BP: 117/65  Pulse: 99  Weight: 131 lb (59.4 kg)    Fetal Status:  Fetal Heart Rate (bpm): 145 Fundal Height: 28 cm Movement: Present    General: Alert, oriented and cooperative. Patient is in no acute distress.  Skin: Skin is warm and dry. No rash noted.   Cardiovascular: Normal heart rate noted  Respiratory: Normal respiratory effort, no problems with respiration noted  Abdomen: Soft, gravid, appropriate for gestational age.  Pain/Pressure: Absent     Pelvic: Cervical exam deferred        Extremities: Normal range of motion.  Edema: None  Mental Status: Normal mood and affect. Normal behavior. Normal judgment and thought content.   Assessment and Plan:  Pregnancy: G4P2012 at [redacted]w[redacted]d 1. [redacted] weeks gestation of pregnancy   2. Supervision of other normal pregnancy, antepartum (Primary) Continue prenatal care. Offer TDaP next visit.  3. Anemia affecting pregnancy in third trimester Hgb was 9.4.  Offered and declined IV iron PO repletion - ferrous sulfate  325 (65 FE) MG EC tablet; Take 1 tablet (325 mg total) by mouth every other day.  Dispense: 45 tablet; Refill: 3  4. Rubella non-immune status, antepartum MMR pp  5. Alpha  thalassemia silent carrier   Preterm labor symptoms and general obstetric precautions including but not limited to vaginal bleeding, contractions, leaking of fluid and fetal movement were reviewed in detail with the patient. Please refer to After Visit Summary for other counseling recommendations.   Return in 2 weeks (on 03/05/2024).  Future Appointments  Date Time Provider Department Center  02/21/2024  2:00 PM [facility] PROVIDER 1 [facility]  02/21/2024  2:15 PM [facility] NST [facility]  02/21/2024  3:00 PM [facility] US5 [facility]  02/29/2024  2:00 PM [facility] PROVIDER 1 [facility]  02/29/2024  2:15 PM [facility] NST [facility]  02/29/2024  3:30 PM [facility] US1 [facility]  03/01/2024  4:15 PM Cashion, Barkley CROME, MD [facility]    Mary GORMAN Birk, MD

## 2024-02-21 ENCOUNTER — Other Ambulatory Visit: Payer: Self-pay | Admitting: *Deleted

## 2024-02-21 ENCOUNTER — Ambulatory Visit (HOSPITAL_BASED_OUTPATIENT_CLINIC_OR_DEPARTMENT_OTHER): Admitting: Obstetrics and Gynecology

## 2024-02-21 ENCOUNTER — Ambulatory Visit: Attending: Obstetrics and Gynecology

## 2024-02-21 ENCOUNTER — Ambulatory Visit (HOSPITAL_BASED_OUTPATIENT_CLINIC_OR_DEPARTMENT_OTHER)

## 2024-02-21 VITALS — BP 102/62

## 2024-02-21 DIAGNOSIS — O99013 Anemia complicating pregnancy, third trimester: Secondary | ICD-10-CM | POA: Diagnosis not present

## 2024-02-21 DIAGNOSIS — D563 Thalassemia minor: Secondary | ICD-10-CM

## 2024-02-21 DIAGNOSIS — O365930 Maternal care for other known or suspected poor fetal growth, third trimester, not applicable or unspecified: Secondary | ICD-10-CM | POA: Insufficient documentation

## 2024-02-21 DIAGNOSIS — D561 Beta thalassemia: Secondary | ICD-10-CM | POA: Insufficient documentation

## 2024-02-21 DIAGNOSIS — Z3480 Encounter for supervision of other normal pregnancy, unspecified trimester: Secondary | ICD-10-CM

## 2024-02-21 DIAGNOSIS — D560 Alpha thalassemia: Secondary | ICD-10-CM | POA: Insufficient documentation

## 2024-02-21 DIAGNOSIS — Z3A29 29 weeks gestation of pregnancy: Secondary | ICD-10-CM | POA: Diagnosis not present

## 2024-02-21 DIAGNOSIS — O09892 Supervision of other high risk pregnancies, second trimester: Secondary | ICD-10-CM | POA: Insufficient documentation

## 2024-02-21 DIAGNOSIS — Z148 Genetic carrier of other disease: Secondary | ICD-10-CM | POA: Insufficient documentation

## 2024-02-21 DIAGNOSIS — O365920 Maternal care for other known or suspected poor fetal growth, second trimester, not applicable or unspecified: Secondary | ICD-10-CM | POA: Diagnosis present

## 2024-02-21 NOTE — Progress Notes (Signed)
 After review, MFM consult with provider is not indicated for today  Arna Ranks, MD 02/21/2024 5:44 PM  [facility]

## 2024-02-21 NOTE — Procedures (Signed)
 Mary Vincent [birth year] [redacted]w[redacted]d  Fetus A Non-Stress Test Interpretation for 02/21/24  Indication: IUGR  Fetal Heart Rate A Mode: External Baseline Rate (A): 145 bpm Variability: Moderate Accelerations: 10 x 10 Decelerations: None Multiple birth?: No  Uterine Activity Mode: Toco, Palpation Contraction Frequency (min): none noted Resting Tone Palpated: Relaxed  Interpretation (Fetal Testing) Nonstress Test Interpretation: Reactive Comments: Reviewed with Dr. Arna

## 2024-02-29 ENCOUNTER — Other Ambulatory Visit

## 2024-03-01 ENCOUNTER — Ambulatory Visit: Attending: Obstetrics and Gynecology | Admitting: Obstetrics and Gynecology

## 2024-03-01 ENCOUNTER — Ambulatory Visit (INDEPENDENT_AMBULATORY_CARE_PROVIDER_SITE_OTHER): Admitting: Obstetrics and Gynecology

## 2024-03-01 ENCOUNTER — Other Ambulatory Visit: Payer: Self-pay

## 2024-03-01 ENCOUNTER — Ambulatory Visit (HOSPITAL_BASED_OUTPATIENT_CLINIC_OR_DEPARTMENT_OTHER)

## 2024-03-01 ENCOUNTER — Other Ambulatory Visit (HOSPITAL_COMMUNITY)
Admission: RE | Admit: 2024-03-01 | Discharge: 2024-03-01 | Disposition: A | Discharge status: Home / Self Care | Source: Ambulatory Visit | Attending: Obstetrics and Gynecology | Admitting: Obstetrics and Gynecology

## 2024-03-01 VITALS — BP 108/71 | HR 88

## 2024-03-01 DIAGNOSIS — O365930 Maternal care for other known or suspected poor fetal growth, third trimester, not applicable or unspecified: Secondary | ICD-10-CM | POA: Diagnosis not present

## 2024-03-01 DIAGNOSIS — O99013 Anemia complicating pregnancy, third trimester: Secondary | ICD-10-CM

## 2024-03-01 DIAGNOSIS — D563 Thalassemia minor: Secondary | ICD-10-CM | POA: Diagnosis not present

## 2024-03-01 DIAGNOSIS — O0993 Supervision of high risk pregnancy, unspecified, third trimester: Secondary | ICD-10-CM

## 2024-03-01 DIAGNOSIS — Z3A30 30 weeks gestation of pregnancy: Secondary | ICD-10-CM | POA: Diagnosis not present

## 2024-03-01 DIAGNOSIS — Z148 Genetic carrier of other disease: Secondary | ICD-10-CM | POA: Diagnosis not present

## 2024-03-01 DIAGNOSIS — N898 Other specified noninflammatory disorders of vagina: Secondary | ICD-10-CM

## 2024-03-01 DIAGNOSIS — O0990 Supervision of high risk pregnancy, unspecified, unspecified trimester: Secondary | ICD-10-CM

## 2024-03-01 DIAGNOSIS — O26893 Other specified pregnancy related conditions, third trimester: Secondary | ICD-10-CM

## 2024-03-01 DIAGNOSIS — D649 Anemia, unspecified: Secondary | ICD-10-CM

## 2024-03-01 DIAGNOSIS — O365990 Maternal care for other known or suspected poor fetal growth, unspecified trimester, not applicable or unspecified: Secondary | ICD-10-CM

## 2024-03-01 DIAGNOSIS — O99891 Other specified diseases and conditions complicating pregnancy: Secondary | ICD-10-CM | POA: Diagnosis not present

## 2024-03-01 DIAGNOSIS — Z362 Encounter for other antenatal screening follow-up: Secondary | ICD-10-CM | POA: Diagnosis not present

## 2024-03-01 NOTE — Progress Notes (Signed)
 Maternal-Fetal Medicine Consultation  Name: Mary Vincent  MRN: [id number]  GA: [id number] [redacted]w[redacted]d   Fetal growth restriction.  On ultrasound performed last week, the estimated fetal weight was at the 7th percentile and the abdominal circumference measurement at the 9th percentile. Blood pressure today at our office is 108/71 mmHg.  Ultrasound Amniotic fluid normal good fetal activity seen.  Umbilical artery Doppler showed normal forward diastolic flow.  NST is reactive.  I reassured the patient of the findings.  I discussed the components and significance of antenatal testing.  Patient was counseled that placental insufficiency is the most common cause of fetal growth restriction.  I reassured the patient of normal appearing NST. We will discuss timing of delivery after future fetal growth assessments.  Recommendations - Continue weekly antenatal testing till delivery.     Consultation including face-to-face (more than 50%) counseling 10 minutes.

## 2024-03-01 NOTE — Procedures (Signed)
 Mary Vincent [birth year] [redacted]w[redacted]d  Fetus A Non-Stress Test Interpretation for 03/01/24  Indication: IUGR  Fetal Heart Rate A Mode: External Baseline Rate (A): 140 bpm Variability: Moderate Accelerations: 10 x 10 Decelerations: None Multiple birth?: No  Uterine Activity Mode: Palpation, Toco Contraction Frequency (min): none noted Resting Tone Palpated: Relaxed  Interpretation (Fetal Testing) Nonstress Test Interpretation: Reactive Comments: Reviewed with Dr. Arna

## 2024-03-01 NOTE — Progress Notes (Signed)
   PRENATAL VISIT NOTE  Subjective:  Mary Vincent is a 23 y.o. H5E7987 at [redacted]w[redacted]d being seen today for ongoing prenatal care.  She is currently monitored for the following issues for this high-risk pregnancy and has Rubella non-immune status, antepartum; Alpha thalassemia silent carrier; Supervision of other normal pregnancy, antepartum; and IUGR (intrauterine growth restriction) affecting care of mother on their problem list.  Patient reports mild vaginal discharge.  Contractions: Not present. Vag. Bleeding: None.  Movement: Present. Denies leaking of fluid.   The following portions of the patient's history were reviewed and updated as appropriate: allergies, current medications, past family history, past medical history, past social history, past surgical history and problem list.   Objective:    Vitals:   03/01/24 1609  BP: 108/71  Pulse: 88    Fetal Status:  Fetal Heart Rate (bpm): 158   Movement: Present    General: Alert, oriented and cooperative. Patient is in no acute distress.  Skin: Skin is warm and dry. No rash noted.   Cardiovascular: Normal heart rate noted  Respiratory: Normal respiratory effort, no problems with respiration noted  Abdomen: Soft, gravid, appropriate for gestational age.  Pain/Pressure: Absent     Pelvic: Cervical exam deferred        Extremities: Normal range of motion.  Edema: None  Mental Status: Normal mood and affect. Normal behavior. Normal judgment and thought content.   Assessment and Plan:  Pregnancy: G4P2012 at [redacted]w[redacted]d 1. [redacted] weeks gestation of pregnancy (Primary) BP and FHT within normal limits Continue to keep scheduled OB appointments and ultrasounds No concerns  2. Supervision of high risk pregnancy, antepartum   3. Fetal growth restriction antepartum 9/16: [redacted]w[redacted]d 1177g 7%ile, AC 9%ile, improved from 5th %ile, NST reactive. Had repeat today, awaiting read.   4. Vaginal discharge during pregnancy in third trimester  -  Cervicovaginal ancillary only( [place])  5. Anemia affecting pregnancy in third trimester Patient states she doesn't want to take oral tablets and would rather have IV infusion. This was offered last visit but was declined at that time. - Amb Referral to Intravenous Iron Therapy  Preterm labor symptoms and general obstetric precautions including but not limited to vaginal bleeding, contractions, leaking of fluid and fetal movement were reviewed in detail with the patient. Please refer to After Visit Summary for other counseling recommendations.   No follow-ups on file.  Future Appointments  Date Time Provider Department Center  03/06/2024  2:00 PM [facility] PROVIDER 1 [facility]  03/06/2024  2:15 PM [facility] NST [facility]  03/06/2024  3:45 PM [facility] US2 [facility]  03/13/2024 10:55 AM Nicholaus Burnard HERO, MD [facility]  03/14/2024  3:15 PM [facility] PROVIDER 1 [facility]  03/14/2024  3:30 PM [facility] US3 [facility]  03/20/2024  2:45 PM [facility] PROVIDER 1 [facility]  03/20/2024  3:00 PM [facility] US5 [facility]    Kyomi Hector LITTIE Angles, MD

## 2024-03-02 ENCOUNTER — Other Ambulatory Visit (HOSPITAL_COMMUNITY): Payer: Self-pay | Admitting: Obstetrics and Gynecology

## 2024-03-02 ENCOUNTER — Telehealth (HOSPITAL_COMMUNITY): Payer: Self-pay

## 2024-03-02 DIAGNOSIS — O99013 Anemia complicating pregnancy, third trimester: Secondary | ICD-10-CM | POA: Insufficient documentation

## 2024-03-02 LAB — CERVICOVAGINAL ANCILLARY ONLY
Bacterial Vaginitis (gardnerella): NEGATIVE
Candida Glabrata: NEGATIVE
Candida Vaginitis: NEGATIVE
Chlamydia: NEGATIVE
Comment: NEGATIVE
Comment: NEGATIVE
Comment: NEGATIVE
Comment: NEGATIVE
Comment: NEGATIVE
Comment: NORMAL
Neisseria Gonorrhea: NEGATIVE
Trichomonas: NEGATIVE

## 2024-03-02 NOTE — Telephone Encounter (Signed)
 Auth Submission: NO AUTH NEEDED Site of care: Site of care: [facility] Payer: [facility] Medicaid Medication & CPT/J Code(s) submitted: Feraheme (ferumoxytol) [id number] Diagnosis Code: O99.013 Route of submission (phone, fax, portal): portal Phone # Fax # Auth type: Buy/Bill HB Units/visits requested: 510mg  x 2 doses Reference number:  Approval from: 03/02/24 to 06/01/24

## 2024-03-03 ENCOUNTER — Ambulatory Visit: Payer: Self-pay | Admitting: Obstetrics and Gynecology

## 2024-03-06 ENCOUNTER — Ambulatory Visit (HOSPITAL_BASED_OUTPATIENT_CLINIC_OR_DEPARTMENT_OTHER): Admitting: Obstetrics

## 2024-03-06 ENCOUNTER — Other Ambulatory Visit: Payer: Self-pay | Admitting: Obstetrics and Gynecology

## 2024-03-06 ENCOUNTER — Ambulatory Visit: Attending: Obstetrics and Gynecology

## 2024-03-06 ENCOUNTER — Ambulatory Visit (HOSPITAL_BASED_OUTPATIENT_CLINIC_OR_DEPARTMENT_OTHER)

## 2024-03-06 DIAGNOSIS — O99013 Anemia complicating pregnancy, third trimester: Secondary | ICD-10-CM | POA: Diagnosis not present

## 2024-03-06 DIAGNOSIS — Z148 Genetic carrier of other disease: Secondary | ICD-10-CM | POA: Insufficient documentation

## 2024-03-06 DIAGNOSIS — Z3480 Encounter for supervision of other normal pregnancy, unspecified trimester: Secondary | ICD-10-CM | POA: Insufficient documentation

## 2024-03-06 DIAGNOSIS — D560 Alpha thalassemia: Secondary | ICD-10-CM | POA: Diagnosis not present

## 2024-03-06 DIAGNOSIS — Z3A31 31 weeks gestation of pregnancy: Secondary | ICD-10-CM | POA: Diagnosis present

## 2024-03-06 DIAGNOSIS — O365930 Maternal care for other known or suspected poor fetal growth, third trimester, not applicable or unspecified: Secondary | ICD-10-CM

## 2024-03-06 DIAGNOSIS — D561 Beta thalassemia: Secondary | ICD-10-CM | POA: Diagnosis not present

## 2024-03-06 DIAGNOSIS — Z362 Encounter for other antenatal screening follow-up: Secondary | ICD-10-CM | POA: Insufficient documentation

## 2024-03-06 DIAGNOSIS — D649 Anemia, unspecified: Secondary | ICD-10-CM | POA: Diagnosis not present

## 2024-03-06 NOTE — Procedures (Signed)
 Mary Vincent 05-16-2001 [redacted]w[redacted]d  Fetus A Non-Stress Test Interpretation for 03/06/24  Indication: IUGR  Fetal Heart Rate A Mode: External Baseline Rate (A): 140 bpm Variability: Moderate Accelerations: 15 x 15 Decelerations: None Multiple birth?: No  Uterine Activity Mode: Palpation, Toco Contraction Frequency (min): none noted Resting Tone Palpated: Relaxed  Interpretation (Fetal Testing) Nonstress Test Interpretation: Reactive Comments: Reviewed with Dr. Ileana

## 2024-03-06 NOTE — Progress Notes (Signed)
 MFM Note  Mary Vincent is currently at 31 weeks and 3 days.  She has been followed due to IUGR.  She denies any problems since her last exam.    She had a reactive NST today.  The total AFI was 13.67 cm.  Doppler studies of the umbilical arteries performed due to IUGR showed a normal S/D ratio of 2.63.  There were no signs of absent or reversed end-diastolic flow noted today.  She will return in 1 week for a BPP, growth scan, and umbilical artery Doppler study.

## 2024-03-07 ENCOUNTER — Ambulatory Visit (INDEPENDENT_AMBULATORY_CARE_PROVIDER_SITE_OTHER): Admitting: *Deleted

## 2024-03-07 VITALS — BP 94/55 | HR 79 | Temp 98.1°F | Resp 16 | Ht 65.0 in | Wt 137.4 lb

## 2024-03-07 DIAGNOSIS — D649 Anemia, unspecified: Secondary | ICD-10-CM

## 2024-03-07 DIAGNOSIS — Z3A31 31 weeks gestation of pregnancy: Secondary | ICD-10-CM | POA: Diagnosis not present

## 2024-03-07 DIAGNOSIS — O99013 Anemia complicating pregnancy, third trimester: Secondary | ICD-10-CM

## 2024-03-07 MED ORDER — SODIUM CHLORIDE 0.9 % IV SOLN
510.0000 mg | Freq: Once | INTRAVENOUS | Status: AC
  Administered 2024-03-07: 510 mg via INTRAVENOUS
  Filled 2024-03-07: qty 17

## 2024-03-07 NOTE — Progress Notes (Signed)
 Diagnosis: Iron Deficiency Anemia  Provider:  Mannam, Praveen MD  Procedure: IV Infusion  IV Type: Peripheral, IV Location: L Forearm  Feraheme (Ferumoxytol), Dose: 200 mg  Infusion Start Time: 1015  Infusion Stop Time: 1031  Post Infusion IV Care: Observation period completed  Discharge: Condition: Good, Destination: Home . AVS Declined  Performed by:  Mathew Therisa NOVAK, RN

## 2024-03-13 ENCOUNTER — Encounter: Payer: Self-pay | Admitting: Obstetrics and Gynecology

## 2024-03-13 ENCOUNTER — Other Ambulatory Visit: Payer: Self-pay

## 2024-03-13 ENCOUNTER — Ambulatory Visit: Admitting: Obstetrics and Gynecology

## 2024-03-13 VITALS — BP 105/61 | HR 68 | Wt 136.6 lb

## 2024-03-13 DIAGNOSIS — O365930 Maternal care for other known or suspected poor fetal growth, third trimester, not applicable or unspecified: Secondary | ICD-10-CM

## 2024-03-13 DIAGNOSIS — Z3A32 32 weeks gestation of pregnancy: Secondary | ICD-10-CM

## 2024-03-13 DIAGNOSIS — Z2839 Other underimmunization status: Secondary | ICD-10-CM | POA: Diagnosis not present

## 2024-03-13 DIAGNOSIS — O99013 Anemia complicating pregnancy, third trimester: Secondary | ICD-10-CM

## 2024-03-13 DIAGNOSIS — Z3490 Encounter for supervision of normal pregnancy, unspecified, unspecified trimester: Secondary | ICD-10-CM

## 2024-03-13 DIAGNOSIS — Z3480 Encounter for supervision of other normal pregnancy, unspecified trimester: Secondary | ICD-10-CM

## 2024-03-13 NOTE — Progress Notes (Signed)
   PRENATAL VISIT NOTE  Subjective:  Mary Vincent is a 23 y.o. H5E7987 at [redacted]w[redacted]d being seen today for ongoing prenatal care.  She is currently monitored for the following issues for this high-risk pregnancy and has Rubella non-immune status, antepartum; Alpha thalassemia silent carrier; Supervision of other normal pregnancy, antepartum; IUGR (intrauterine growth restriction) affecting care of mother; and Anemia complicating pregnancy in third trimester on their problem list.  Patient reports no complaints.  Contractions: Not present. Vag. Bleeding: None.  Movement: Present. Denies leaking of fluid.   The following portions of the patient's history were reviewed and updated as appropriate: allergies, current medications, past family history, past medical history, past social history, past surgical history and problem list.   Objective:    Vitals:   03/13/24 1136  BP: 105/61  Pulse: 68  Weight: 136 lb 9.6 oz (62 kg)    Fetal Status:  Fetal Heart Rate (bpm): 146   Movement: Present    General: Alert, oriented and cooperative. Patient is in no acute distress.  Skin: Skin is warm and dry. No rash noted.   Cardiovascular: Normal heart rate noted  Respiratory: Normal respiratory effort, no problems with respiration noted  Abdomen: Soft, gravid, appropriate for gestational age.  Pain/Pressure: Absent     Pelvic: Cervical exam deferred        Extremities: Normal range of motion.  Edema: None  Mental Status: Normal mood and affect. Normal behavior. Normal judgment and thought content.   Assessment and Plan:  Pregnancy: H5E7987 at [redacted]w[redacted]d  1. [redacted] weeks gestation of pregnancy (Primary)  2. Rubella non-immune status, antepartum MMR pp  3. Supervision of other normal pregnancy, antepartum  4. Poor fetal growth affecting management of mother in third trimester, single or unspecified fetus Last growth 7th% Normal dopplers Has f/u tomorrow  5. Anemia complicating pregnancy in third  trimester S/p one iron infusion Has another scheduled   Preterm labor symptoms and general obstetric precautions including but not limited to vaginal bleeding, contractions, leaking of fluid and fetal movement were reviewed in detail with the patient. Please refer to After Visit Summary for other counseling recommendations.   Return in about 2 weeks (around 03/27/2024) for high OB.  Future Appointments  Date Time Provider Department Center  03/14/2024  3:15 PM [facility] PROVIDER 1 [facility]  03/14/2024  3:30 PM [facility] US3 [facility]  03/19/2024  9:30 AM CH[facility]-CHAIR 5 [facility]-INFWM None  03/20/2024  2:45 PM [facility] PROVIDER 1 [facility]  03/20/2024  3:00 PM [facility] US5 [facility]  04/09/2024 10:15 AM Delores Nidia CROME, FNP [facility]  04/16/2024  1:15 PM Nicholaus Burnard HERO, MD [facility]  04/23/2024  1:15 PM Nicholaus Burnard HERO, MD [facility]  04/30/2024  1:15 PM Nicholaus Burnard HERO, MD [facility]  05/07/2024  4:15 PM W[facility]-GENERAL 2 [facility]    Burnard HERO Nicholaus, MD

## 2024-03-14 ENCOUNTER — Ambulatory Visit: Attending: Obstetrics and Gynecology

## 2024-03-14 ENCOUNTER — Ambulatory Visit (HOSPITAL_BASED_OUTPATIENT_CLINIC_OR_DEPARTMENT_OTHER): Admitting: Obstetrics and Gynecology

## 2024-03-14 VITALS — BP 100/61 | HR 70

## 2024-03-14 DIAGNOSIS — O365930 Maternal care for other known or suspected poor fetal growth, third trimester, not applicable or unspecified: Secondary | ICD-10-CM | POA: Diagnosis present

## 2024-03-14 DIAGNOSIS — Z3480 Encounter for supervision of other normal pregnancy, unspecified trimester: Secondary | ICD-10-CM

## 2024-03-14 DIAGNOSIS — Z148 Genetic carrier of other disease: Secondary | ICD-10-CM | POA: Diagnosis not present

## 2024-03-14 DIAGNOSIS — Z3A32 32 weeks gestation of pregnancy: Secondary | ICD-10-CM | POA: Diagnosis present

## 2024-03-14 DIAGNOSIS — Z362 Encounter for other antenatal screening follow-up: Secondary | ICD-10-CM | POA: Diagnosis not present

## 2024-03-14 NOTE — Progress Notes (Signed)
 Maternal-Fetal Medicine Consultation  Name: Mary Vincent  MRN: [id number]  GA: H5E7987 [redacted]w[redacted]d  Fetal growth restriction.  On previous ultrasound performed 3 weeks ago, the estimated fetal weight was at the 7th percentile and the abdominal circumference measurement was at the 9th percentile. Patient does not have chronic medical conditions including hypertension.  Blood pressure today at our office is 100/61 mmHg.   Ultrasound The estimated fetal weight is at the 9th percentile and abdominal circumference measurement at the 37th percentile.  Interval weight gain is 544 g.  Head circumference measurement is set between -1 SD and mean (normal). Normal amniotic fluid.  Umbilical artery Doppler showed normal forward diastolic flow.  BPP 8/8. Mild pericardial effusion is seen, which is physiological.  Fetal growth restriction I explained the finding of good interval growth and mild fetal growth restriction.  I reassured the patient that pericardial effusion is physiological and is not associated with adverse outcomes. I discussed antenatal testing till delivery.  We will recommend timing of delivery at her follow-up fetal growth assessment.   Recommendations - BPP and UA Doppler next week. - NSTs (week 2 and week 3). - Fetal growth assessment, BPP and UA Doppler in 4 weeks.     Consultation including face-to-face (more than 50%) counseling 10 minutes.

## 2024-03-15 ENCOUNTER — Other Ambulatory Visit: Payer: Self-pay | Admitting: *Deleted

## 2024-03-15 DIAGNOSIS — O365990 Maternal care for other known or suspected poor fetal growth, unspecified trimester, not applicable or unspecified: Secondary | ICD-10-CM

## 2024-03-19 ENCOUNTER — Ambulatory Visit (INDEPENDENT_AMBULATORY_CARE_PROVIDER_SITE_OTHER)

## 2024-03-19 VITALS — BP 98/65 | HR 82 | Temp 98.3°F | Resp 16 | Ht 65.0 in | Wt 139.8 lb

## 2024-03-19 DIAGNOSIS — Z3A33 33 weeks gestation of pregnancy: Secondary | ICD-10-CM | POA: Diagnosis not present

## 2024-03-19 DIAGNOSIS — D649 Anemia, unspecified: Secondary | ICD-10-CM

## 2024-03-19 DIAGNOSIS — O99013 Anemia complicating pregnancy, third trimester: Secondary | ICD-10-CM

## 2024-03-19 MED ORDER — SODIUM CHLORIDE 0.9 % IV SOLN
510.0000 mg | Freq: Once | INTRAVENOUS | Status: AC
  Administered 2024-03-19: 510 mg via INTRAVENOUS
  Filled 2024-03-19: qty 17

## 2024-03-19 NOTE — Progress Notes (Signed)
 Diagnosis: Acute Anemia  Provider:  Mannam, Praveen MD  Procedure: IV Infusion  IV Type: Peripheral, IV Location: L Antecubital  Feraheme (Ferumoxytol), Dose: 510 mg  Infusion Start Time: 1001  Infusion Stop Time: 1021  Post Infusion IV Care: Observation period completed and Peripheral IV Discontinued  Discharge: Condition: Stable, Destination: Home . AVS Declined  Performed by:  Rocky FORBES Sar, RN

## 2024-03-20 ENCOUNTER — Other Ambulatory Visit

## 2024-03-20 ENCOUNTER — Ambulatory Visit

## 2024-03-28 ENCOUNTER — Ambulatory Visit

## 2024-03-28 ENCOUNTER — Ambulatory Visit: Attending: Obstetrics and Gynecology | Admitting: *Deleted

## 2024-03-28 VITALS — BP 107/62

## 2024-03-28 DIAGNOSIS — Z3480 Encounter for supervision of other normal pregnancy, unspecified trimester: Secondary | ICD-10-CM

## 2024-03-28 DIAGNOSIS — Z3A34 34 weeks gestation of pregnancy: Secondary | ICD-10-CM | POA: Diagnosis not present

## 2024-03-28 DIAGNOSIS — O365930 Maternal care for other known or suspected poor fetal growth, third trimester, not applicable or unspecified: Secondary | ICD-10-CM | POA: Diagnosis present

## 2024-03-28 NOTE — Procedures (Signed)
 Mary Vincent 2000-10-07 [redacted]w[redacted]d  Fetus A Non-Stress Test Interpretation for 03/28/24  Indication: IUGR  Fetal Heart Rate A Mode: External Baseline Rate (A): 140 bpm Variability: Moderate Accelerations: 15 x 15 Decelerations: None Multiple birth?: No  Uterine Activity Mode: Palpation, Toco Contraction Frequency (min): none Resting Tone Palpated: Relaxed  Interpretation (Fetal Testing) Nonstress Test Interpretation: Reactive Overall Impression: Reassuring for gestational age Comments: Dr. Ileana reviewed tracing

## 2024-04-04 ENCOUNTER — Ambulatory Visit

## 2024-04-04 ENCOUNTER — Other Ambulatory Visit

## 2024-04-09 ENCOUNTER — Ambulatory Visit (INDEPENDENT_AMBULATORY_CARE_PROVIDER_SITE_OTHER): Admitting: Obstetrics and Gynecology

## 2024-04-09 ENCOUNTER — Encounter: Payer: Self-pay | Admitting: Obstetrics and Gynecology

## 2024-04-09 ENCOUNTER — Other Ambulatory Visit (HOSPITAL_COMMUNITY)
Admission: RE | Admit: 2024-04-09 | Discharge: 2024-04-09 | Disposition: A | Discharge status: Home / Self Care | Source: Ambulatory Visit | Attending: Obstetrics and Gynecology | Admitting: Obstetrics and Gynecology

## 2024-04-09 ENCOUNTER — Other Ambulatory Visit: Payer: Self-pay

## 2024-04-09 VITALS — BP 104/63 | HR 68 | Wt 147.6 lb

## 2024-04-09 DIAGNOSIS — O365930 Maternal care for other known or suspected poor fetal growth, third trimester, not applicable or unspecified: Secondary | ICD-10-CM

## 2024-04-09 DIAGNOSIS — O99013 Anemia complicating pregnancy, third trimester: Secondary | ICD-10-CM | POA: Diagnosis not present

## 2024-04-09 DIAGNOSIS — Z3483 Encounter for supervision of other normal pregnancy, third trimester: Secondary | ICD-10-CM | POA: Diagnosis present

## 2024-04-09 DIAGNOSIS — Z23 Encounter for immunization: Secondary | ICD-10-CM | POA: Diagnosis not present

## 2024-04-09 DIAGNOSIS — Z3A36 36 weeks gestation of pregnancy: Secondary | ICD-10-CM | POA: Diagnosis not present

## 2024-04-09 DIAGNOSIS — Z3480 Encounter for supervision of other normal pregnancy, unspecified trimester: Secondary | ICD-10-CM

## 2024-04-09 NOTE — Progress Notes (Signed)
 PRENATAL VISIT NOTE  Subjective:  Mallisa Alameda is a 23 y.o. H5E7987 at [redacted]w[redacted]d being seen today for ongoing prenatal care.  She is currently monitored for the following issues for this low-risk pregnancy and has Rubella non-immune status, antepartum; Alpha thalassemia silent carrier; Supervision of other normal pregnancy, antepartum; IUGR (intrauterine growth restriction) affecting care of mother; and Anemia complicating pregnancy in third trimester on their problem list.  Patient reports no complaints.  Contractions: Not present. Vag. Bleeding: None.  Movement: Present. Denies leaking of fluid.   The following portions of the patient's history were reviewed and updated as appropriate: allergies, current medications, past family history, past medical history, past social history, past surgical history and problem list.   Objective:   Vitals:   04/09/24 1023  BP: 104/63  Pulse: 68  Weight: 147 lb 9.6 oz (67 kg)    Fetal Status:  Fetal Heart Rate (bpm): 146   Movement: Present    General: Alert, oriented and cooperative. Patient is in no acute distress.  Skin: Skin is warm and dry. No rash noted.   Cardiovascular: Normal heart rate noted  Respiratory: Normal respiratory effort, no problems with respiration noted  Abdomen: Soft, gravid, appropriate for gestational age.  Pain/Pressure: Absent     Pelvic: Cervical exam deferred        Extremities: Normal range of motion.  Edema: None  Mental Status: Normal mood and affect. Normal behavior. Normal judgment and thought content.      12/07/2023   11:14 AM 02/09/2023    2:56 PM 10/27/2022    4:56 PM  Depression screen PHQ 2/9  Decreased Interest 0 2 0  Down, Depressed, Hopeless 0 0 0  PHQ - 2 Score 0 2 0  Altered sleeping 0 0 0  Tired, decreased energy 0 0 2  Change in appetite 0 0 0  Feeling bad or failure about yourself  0 0 0  Trouble concentrating 0 0 0  Moving slowly or fidgety/restless 0 0 0  Suicidal thoughts 0 0 0  PHQ-9  Score 0 2 2        12/07/2023   11:15 AM 02/09/2023    2:56 PM 10/27/2022    4:56 PM 02/16/2021   11:18 AM  GAD 7 : Generalized Anxiety Score  Nervous, Anxious, on Edge 0 0 0 0  Control/stop worrying 0 0 0 0  Worry too much - different things 0 0 0 0  Trouble relaxing 0 0 0 0  Restless 0 0 0 0  Easily annoyed or irritable 0 0 0 0  Afraid - awful might happen 0 0 0 0  Total GAD 7 Score 0 0 0 0    Assessment and Plan:  Pregnancy: H5E7987 at [redacted]w[redacted]d 1. Supervision of other normal pregnancy, antepartum (Primary) BP and FHR normal Doing well, feeling regular movement    2. Anemia complicating pregnancy in third trimester 9/3 cbc 9.4 S/p iv iron x2   3. Poor fetal growth affecting management of mother in third trimester, single or unspecified fetus 10/8 u/s EFW 9%, BPP 8/8, normal dopplers Delivery 38-39 weeks IOL scheduled 11/17,orders placed  Precautions given when to follow up sooner  4. [redacted] weeks gestation of pregnancy Swabs today  Labor precautions  Decline RSV  Preterm labor symptoms and general obstetric precautions including but not limited to vaginal bleeding, contractions, leaking of fluid and fetal movement were reviewed in detail with the patient. Please refer to After Visit Summary for other counseling recommendations.  Future Appointments  Date Time Provider Department Center  04/12/2024  3:30 PM [facility] PROVIDER 1 [facility]  04/12/2024  3:45 PM [facility] US4 [facility]  04/16/2024  1:15 PM Nicholaus Burnard HERO, MD [facility]  04/23/2024  6:45 AM MC-LD SCHED ROOM MC-INDC None  04/23/2024  1:15 PM Nicholaus Burnard HERO, MD [facility]  04/30/2024  1:15 PM Nicholaus Burnard HERO, MD [facility]  05/07/2024  4:15 PM Jerilynn Longs, NP [facility]    Nidia Daring, FNP

## 2024-04-10 ENCOUNTER — Ambulatory Visit: Payer: Self-pay | Admitting: Obstetrics and Gynecology

## 2024-04-10 LAB — GC/CHLAMYDIA PROBE AMP (~~LOC~~) NOT AT ARMC
Chlamydia: NEGATIVE
Comment: NEGATIVE
Comment: NORMAL
Neisseria Gonorrhea: NEGATIVE

## 2024-04-12 ENCOUNTER — Ambulatory Visit: Attending: Obstetrics and Gynecology

## 2024-04-12 ENCOUNTER — Ambulatory Visit (HOSPITAL_BASED_OUTPATIENT_CLINIC_OR_DEPARTMENT_OTHER): Admitting: Obstetrics

## 2024-04-12 VITALS — BP 100/66 | HR 75

## 2024-04-12 DIAGNOSIS — O365990 Maternal care for other known or suspected poor fetal growth, unspecified trimester, not applicable or unspecified: Secondary | ICD-10-CM | POA: Insufficient documentation

## 2024-04-12 DIAGNOSIS — O365930 Maternal care for other known or suspected poor fetal growth, third trimester, not applicable or unspecified: Secondary | ICD-10-CM

## 2024-04-12 DIAGNOSIS — D563 Thalassemia minor: Secondary | ICD-10-CM | POA: Diagnosis not present

## 2024-04-12 DIAGNOSIS — O99013 Anemia complicating pregnancy, third trimester: Secondary | ICD-10-CM

## 2024-04-12 DIAGNOSIS — Z3A36 36 weeks gestation of pregnancy: Secondary | ICD-10-CM | POA: Insufficient documentation

## 2024-04-12 NOTE — Progress Notes (Signed)
 MFM Consult Note  Mary Vincent is currently at 36 weeks and 5 days.  She has been followed due to IUGR.    She denies any problems since her last exam and reports feeling fetal movements throughout the day.    Sonographic findings Single intrauterine pregnancy at 36w 5d. Fetal cardiac activity: Observed. Presentation: Cephalic. Fetal biometry shows the estimated fetal weight of 5 pounds 5 ounces which measures at the 7th percentile. Amniotic fluid: Within normal limits.  AFI: 15.41 cm.  MVP: 4.61 cm. Placenta: Posterior. BPP:8/8.   Doppler studies of the umbilical arteries showed a normal S/D ratio of 2.17 .  There were no signs of absent or reversed end-diastolic flow.   Due to IUGR, she already has an induction of labor scheduled on April 23, 2024.    She will return in 1 week for an NST.    Fetal kick count instructions were reviewed.    The patient stated that all of her questions were answered today.  A total of 20 minutes was spent counseling and coordinating the care for this patient.  Greater than 50% of the time was spent in direct face-to-face contact.

## 2024-04-13 LAB — CULTURE, BETA STREP (GROUP B ONLY): Strep Gp B Culture: NEGATIVE

## 2024-04-16 ENCOUNTER — Telehealth (HOSPITAL_COMMUNITY): Payer: Self-pay | Admitting: *Deleted

## 2024-04-16 ENCOUNTER — Encounter (HOSPITAL_COMMUNITY): Payer: Self-pay | Admitting: *Deleted

## 2024-04-16 ENCOUNTER — Encounter: Admitting: Obstetrics and Gynecology

## 2024-04-16 NOTE — Telephone Encounter (Signed)
 Preadmission screen

## 2024-04-16 NOTE — Progress Notes (Unsigned)
 PRENATAL VISIT NOTE  Subjective:  Mary Vincent is a 23 y.o. [id number] at [redacted]w[redacted]d being seen today for ongoing prenatal care.  She is currently monitored for the following issues for this low-risk pregnancy and has Rubella non-immune status, antepartum; Alpha thalassemia silent carrier; Supervision of other normal pregnancy, antepartum; IUGR (intrauterine growth restriction) affecting care of mother; and Anemia complicating pregnancy in third trimester on their problem list.  Patient reports {sx:14538}.   .  .   . Denies leaking of fluid.   The following portions of the patient's history were reviewed and updated as appropriate: allergies, current medications, past family history, past medical history, past social history, past surgical history and problem list.   Objective:   There were no vitals filed for this visit.  Fetal Status:           General: Alert, oriented and cooperative. Patient is in no acute distress.  Skin: Skin is warm and dry. No rash noted.   Cardiovascular: Normal heart rate noted  Respiratory: Normal respiratory effort, no problems with respiration noted  Abdomen: Soft, gravid, appropriate for gestational age.        Pelvic: {Blank single:19197::Cervical exam performed in the presence of a chaperone,Cervical exam deferred}        Extremities: Normal range of motion.     Mental Status: Normal mood and affect. Normal behavior. Normal judgment and thought content.      12/07/2023   11:14 AM 02/09/2023    2:56 PM 10/27/2022    4:56 PM  Depression screen PHQ 2/9  Decreased Interest 0 2 0  Down, Depressed, Hopeless 0 0 0  PHQ - 2 Score 0 2 0  Altered sleeping 0 0 0  Tired, decreased energy 0 0 2  Change in appetite 0 0 0  Feeling bad or failure about yourself  0 0 0  Trouble concentrating 0 0 0  Moving slowly or fidgety/restless 0 0 0  Suicidal thoughts 0 0 0  PHQ-9 Score 0  2  2      Data saved with a previous flowsheet row definition        12/07/2023    11:15 AM 02/09/2023    2:56 PM 10/27/2022    4:56 PM 02/16/2021   11:18 AM  GAD 7 : Generalized Anxiety Score  Nervous, Anxious, on Edge 0 0 0 0  Control/stop worrying 0 0 0 0  Worry too much - different things 0 0 0 0  Trouble relaxing 0 0 0 0  Restless 0 0 0 0  Easily annoyed or irritable 0 0 0 0  Afraid - awful might happen 0 0 0 0  Total GAD 7 Score 0 0 0 0    Assessment and Plan:  Pregnancy: [id number] at [redacted]w[redacted]d 1. Supervision of other normal pregnancy, antepartum (Primary) Prenatal course reviewed BP, HR, FHR within normal limits Feeling regular FM   2. Rubella non-immune status, antepartum Vaccine postpartum  3. Poor fetal growth affecting management of mother in third trimester, single or unspecified fetus IOL scheduled for 11/17 11/6 US  EFW 7%, BPP 8/8, normal dopplers  4. Anemia complicating pregnancy in third trimester S/p IV iron x2  5. Alpha thalassemia silent carrier  6. [redacted] weeks gestation of pregnancy GBS negative  Term labor symptoms and general obstetric precautions including but not limited to vaginal bleeding, contractions, leaking of fluid and fetal movement were reviewed in detail with the patient. Please refer to After Visit Summary for other counseling recommendations.  No follow-ups on file.  Future Appointments  Date Time Provider Department Center  04/17/2024  8:35 AM Wallace Joesph LABOR, [place] [facility]  04/19/2024  1:00 PM [facility] NURSE [facility]  04/19/2024  1:15 PM [facility] NST [facility]  04/23/2024  6:45 AM [facility] SCHED ROOM [facility] None  04/23/2024  1:15 PM Nicholaus Burnard HERO, MD [facility]  04/30/2024  1:15 PM Nicholaus Burnard HERO, MD [facility]  05/07/2024  4:15 PM Jerilynn Longs, NP [facility]    Joesph LABOR Wallace, [place]

## 2024-04-17 ENCOUNTER — Encounter: Admitting: Family Medicine

## 2024-04-17 DIAGNOSIS — O99013 Anemia complicating pregnancy, third trimester: Secondary | ICD-10-CM

## 2024-04-17 DIAGNOSIS — Z3490 Encounter for supervision of normal pregnancy, unspecified, unspecified trimester: Secondary | ICD-10-CM

## 2024-04-17 DIAGNOSIS — O365930 Maternal care for other known or suspected poor fetal growth, third trimester, not applicable or unspecified: Secondary | ICD-10-CM

## 2024-04-17 DIAGNOSIS — Z3A37 37 weeks gestation of pregnancy: Secondary | ICD-10-CM

## 2024-04-17 DIAGNOSIS — Z3480 Encounter for supervision of other normal pregnancy, unspecified trimester: Secondary | ICD-10-CM

## 2024-04-17 DIAGNOSIS — D563 Thalassemia minor: Secondary | ICD-10-CM

## 2024-04-19 ENCOUNTER — Ambulatory Visit

## 2024-04-19 ENCOUNTER — Other Ambulatory Visit

## 2024-04-19 NOTE — Progress Notes (Signed)
 This encounter was created in error - please disregard.

## 2024-04-23 ENCOUNTER — Encounter: Admitting: Obstetrics and Gynecology

## 2024-04-23 ENCOUNTER — Inpatient Hospital Stay (HOSPITAL_COMMUNITY)

## 2024-04-23 ENCOUNTER — Inpatient Hospital Stay (HOSPITAL_COMMUNITY)
Admission: RE | Admit: 2024-04-23 | Discharge: 2024-04-25 | DRG: 787 | Disposition: A | Discharge status: Home / Self Care | Attending: Family Medicine | Admitting: Family Medicine

## 2024-04-23 ENCOUNTER — Inpatient Hospital Stay (HOSPITAL_COMMUNITY): Admitting: Anesthesiology

## 2024-04-23 ENCOUNTER — Other Ambulatory Visit: Payer: Self-pay

## 2024-04-23 ENCOUNTER — Encounter (HOSPITAL_COMMUNITY)
Admission: RE | Disposition: A | Discharge status: Home / Self Care | Payer: Self-pay | Source: Home / Self Care | Attending: Family Medicine

## 2024-04-23 ENCOUNTER — Encounter (HOSPITAL_COMMUNITY): Payer: Self-pay | Admitting: Obstetrics and Gynecology

## 2024-04-23 DIAGNOSIS — O365930 Maternal care for other known or suspected poor fetal growth, third trimester, not applicable or unspecified: Secondary | ICD-10-CM | POA: Diagnosis present

## 2024-04-23 DIAGNOSIS — O9081 Anemia of the puerperium: Secondary | ICD-10-CM | POA: Diagnosis not present

## 2024-04-23 DIAGNOSIS — Z3480 Encounter for supervision of other normal pregnancy, unspecified trimester: Secondary | ICD-10-CM

## 2024-04-23 DIAGNOSIS — D62 Acute posthemorrhagic anemia: Secondary | ICD-10-CM | POA: Diagnosis not present

## 2024-04-23 DIAGNOSIS — O99013 Anemia complicating pregnancy, third trimester: Secondary | ICD-10-CM | POA: Diagnosis present

## 2024-04-23 DIAGNOSIS — Z3A38 38 weeks gestation of pregnancy: Secondary | ICD-10-CM

## 2024-04-23 DIAGNOSIS — O365990 Maternal care for other known or suspected poor fetal growth, unspecified trimester, not applicable or unspecified: Principal | ICD-10-CM | POA: Diagnosis present

## 2024-04-23 DIAGNOSIS — Z148 Genetic carrier of other disease: Secondary | ICD-10-CM | POA: Diagnosis not present

## 2024-04-23 DIAGNOSIS — Z3A39 39 weeks gestation of pregnancy: Secondary | ICD-10-CM

## 2024-04-23 DIAGNOSIS — Z3490 Encounter for supervision of normal pregnancy, unspecified, unspecified trimester: Secondary | ICD-10-CM

## 2024-04-23 DIAGNOSIS — Z8249 Family history of ischemic heart disease and other diseases of the circulatory system: Secondary | ICD-10-CM

## 2024-04-23 DIAGNOSIS — Z2839 Other underimmunization status: Secondary | ICD-10-CM

## 2024-04-23 LAB — TYPE AND SCREEN
ABO/RH(D): O POS
Antibody Screen: NEGATIVE

## 2024-04-23 LAB — CBC
HCT: 34.1 % — ABNORMAL LOW (ref 36.0–46.0)
Hemoglobin: 11.3 g/dL — ABNORMAL LOW (ref 12.0–15.0)
MCH: 26.5 pg (ref 26.0–34.0)
MCHC: 33.1 g/dL (ref 30.0–36.0)
MCV: 79.9 fL — ABNORMAL LOW (ref 80.0–100.0)
Platelets: 140 K/uL — ABNORMAL LOW (ref 150–400)
RBC: 4.27 MIL/uL (ref 3.87–5.11)
RDW: 20.3 % — ABNORMAL HIGH (ref 11.5–15.5)
WBC: 6.5 K/uL (ref 4.0–10.5)
nRBC: 0 % (ref 0.0–0.2)

## 2024-04-23 LAB — RPR: RPR Ser Ql: NONREACTIVE

## 2024-04-23 SURGERY — Surgical Case
Anesthesia: General | Site: Abdomen

## 2024-04-23 MED ORDER — CEFAZOLIN SODIUM-DEXTROSE 2-3 GM-%(50ML) IV SOLR
INTRAVENOUS | Status: DC | PRN
  Administered 2024-04-23: 2 g via INTRAVENOUS

## 2024-04-23 MED ORDER — MENTHOL 3 MG MT LOZG
1.0000 | LOZENGE | OROMUCOSAL | Status: DC | PRN
Start: 2024-04-23 — End: 2024-04-25

## 2024-04-23 MED ORDER — ACETAMINOPHEN 325 MG PO TABS: 650.0000 mg | ORAL_TABLET | ORAL | Status: DC | PRN

## 2024-04-23 MED ORDER — OXYTOCIN-SODIUM CHLORIDE 30-0.9 UT/500ML-% IV SOLN
2.5000 [IU]/h | INTRAVENOUS | Status: AC
  Administered 2024-04-23: 2.5 [IU]/h via INTRAVENOUS
  Filled 2024-04-23: qty 500

## 2024-04-23 MED ORDER — COCONUT OIL OIL: 1.0000 | TOPICAL_OIL | Status: DC | PRN

## 2024-04-23 MED ORDER — STERILE WATER FOR IRRIGATION IR SOLN
Status: DC | PRN
  Administered 2024-04-23: 1000 mL

## 2024-04-23 MED ORDER — HYDROMORPHONE HCL 1 MG/ML IJ SOLN
INTRAMUSCULAR | Status: AC
  Filled 2024-04-23: qty 0.5

## 2024-04-23 MED ORDER — IBUPROFEN 600 MG PO TABS
600.0000 mg | ORAL_TABLET | Freq: Four times a day (QID) | ORAL | Status: DC
  Administered 2024-04-23 – 2024-04-25 (×8): 600 mg via ORAL
  Filled 2024-04-23 (×8): qty 1

## 2024-04-23 MED ORDER — PHENYLEPHRINE HCL-NACL 20-0.9 MG/250ML-% IV SOLN
INTRAVENOUS | Status: DC | PRN
Start: 2024-04-23 — End: 2024-04-23
  Administered 2024-04-23: 30 ug/min via INTRAVENOUS
  Administered 2024-04-23: 40 ug/min via INTRAVENOUS

## 2024-04-23 MED ORDER — METHYLERGONOVINE MALEATE 0.2 MG/ML IJ SOLN
INTRAMUSCULAR | Status: DC | PRN
  Administered 2024-04-23: .2 mg via INTRAMUSCULAR

## 2024-04-23 MED ORDER — FENTANYL CITRATE (PF) 250 MCG/5ML IJ SOLN
INTRAMUSCULAR | Status: DC | PRN
  Administered 2024-04-23: 100 ug via INTRAVENOUS
  Administered 2024-04-23 (×2): 50 ug via INTRAVENOUS

## 2024-04-23 MED ORDER — PRENATAL MULTIVITAMIN CH
1.0000 | ORAL_TABLET | Freq: Every day | ORAL | Status: DC
  Administered 2024-04-23 – 2024-04-25 (×3): 1 via ORAL
  Filled 2024-04-23 (×3): qty 1

## 2024-04-23 MED ORDER — HYDROMORPHONE HCL 1 MG/ML IJ SOLN
INTRAMUSCULAR | Status: AC
  Filled 2024-04-23: qty 1

## 2024-04-23 MED ORDER — LIDOCAINE HCL (PF) 1 % IJ SOLN: 30.0000 mL | INTRAMUSCULAR | Status: DC | PRN

## 2024-04-23 MED ORDER — MISOPROSTOL 25 MCG QUARTER TABLET
25.0000 ug | ORAL_TABLET | Freq: Once | ORAL | Status: AC
  Administered 2024-04-23: 25 ug via VAGINAL
  Filled 2024-04-23: qty 1

## 2024-04-23 MED ORDER — MISOPROSTOL 50MCG HALF TABLET
50.0000 ug | ORAL_TABLET | Freq: Once | ORAL | Status: AC
  Administered 2024-04-23: 50 ug via ORAL
  Filled 2024-04-23: qty 1

## 2024-04-23 MED ORDER — PROPOFOL 10 MG/ML IV BOLUS
INTRAVENOUS | Status: DC | PRN
  Administered 2024-04-23: 140 mg via INTRAVENOUS

## 2024-04-23 MED ORDER — MEASLES, MUMPS & RUBELLA VAC ~~LOC~~ SUSR
0.5000 mL | Freq: Once | SUBCUTANEOUS | Status: AC
  Administered 2024-04-25: 0.5 mL via SUBCUTANEOUS
  Filled 2024-04-23: qty 0.5

## 2024-04-23 MED ORDER — ACETAMINOPHEN 10 MG/ML IV SOLN
INTRAVENOUS | Status: DC | PRN
Start: 2024-04-23 — End: 2024-04-23
  Administered 2024-04-23: 1000 mg via INTRAVENOUS

## 2024-04-23 MED ORDER — HYDROMORPHONE HCL 1 MG/ML IJ SOLN
0.2500 mg | INTRAMUSCULAR | Status: DC | PRN
  Administered 2024-04-23: 0.5 mg via INTRAVENOUS

## 2024-04-23 MED ORDER — WITCH HAZEL-GLYCERIN EX PADS: 1.0000 | MEDICATED_PAD | CUTANEOUS | Status: DC | PRN

## 2024-04-23 MED ORDER — ENOXAPARIN SODIUM 40 MG/0.4ML IJ SOSY
40.0000 mg | PREFILLED_SYRINGE | INTRAMUSCULAR | Status: DC
  Administered 2024-04-24 – 2024-04-25 (×2): 40 mg via SUBCUTANEOUS
  Filled 2024-04-23 (×2): qty 0.4

## 2024-04-23 MED ORDER — LACTATED RINGERS IV SOLN: INTRAVENOUS | Status: DC

## 2024-04-23 MED ORDER — DIPHENHYDRAMINE HCL 25 MG PO CAPS: 25.0000 mg | ORAL_CAPSULE | Freq: Four times a day (QID) | ORAL | Status: DC | PRN

## 2024-04-23 MED ORDER — SUCCINYLCHOLINE CHLORIDE 200 MG/10ML IV SOSY
PREFILLED_SYRINGE | INTRAVENOUS | Status: DC | PRN
  Administered 2024-04-23: 140 mg via INTRAVENOUS

## 2024-04-23 MED ORDER — FENTANYL CITRATE (PF) 100 MCG/2ML IJ SOLN
INTRAMUSCULAR | Status: AC
  Filled 2024-04-23: qty 2

## 2024-04-23 MED ORDER — SIMETHICONE 80 MG PO CHEW
80.0000 mg | CHEWABLE_TABLET | ORAL | Status: DC | PRN
Start: 2024-04-23 — End: 2024-04-25
  Administered 2024-04-24: 80 mg via ORAL
  Filled 2024-04-23: qty 1

## 2024-04-23 MED ORDER — SOD CITRATE-CITRIC ACID 500-334 MG/5ML PO SOLN
30.0000 mL | ORAL | Status: DC | PRN
  Administered 2024-04-23: 30 mL via ORAL
  Filled 2024-04-23: qty 30

## 2024-04-23 MED ORDER — PROPOFOL 10 MG/ML IV BOLUS
INTRAVENOUS | Status: AC
  Filled 2024-04-23: qty 40

## 2024-04-23 MED ORDER — OXYTOCIN BOLUS FROM INFUSION
333.0000 mL | Freq: Once | INTRAVENOUS | Status: AC
  Administered 2024-04-23: 300 mL via INTRAVENOUS

## 2024-04-23 MED ORDER — MEASLES, MUMPS & RUBELLA VAC ~~LOC~~ SUSR: 0.5000 mL | Freq: Once | SUBCUTANEOUS | Status: DC

## 2024-04-23 MED ORDER — KETOROLAC TROMETHAMINE 30 MG/ML IJ SOLN
INTRAMUSCULAR | Status: AC
  Filled 2024-04-23: qty 1

## 2024-04-23 MED ORDER — TRANEXAMIC ACID-NACL 1000-0.7 MG/100ML-% IV SOLN
INTRAVENOUS | Status: DC | PRN
  Administered 2024-04-23: 1000 mg via INTRAVENOUS

## 2024-04-23 MED ORDER — OXYCODONE HCL 5 MG PO TABS
5.0000 mg | ORAL_TABLET | ORAL | Status: DC | PRN
  Administered 2024-04-24 – 2024-04-25 (×8): 10 mg via ORAL
  Filled 2024-04-23 (×8): qty 2

## 2024-04-23 MED ORDER — OXYCODONE HCL 5 MG PO TABS
5.0000 mg | ORAL_TABLET | Freq: Once | ORAL | Status: AC
  Administered 2024-04-23: 5 mg via ORAL
  Filled 2024-04-23: qty 1

## 2024-04-23 MED ORDER — LACTATED RINGERS IV SOLN: 500.0000 mL | INTRAVENOUS | Status: DC | PRN

## 2024-04-23 MED ORDER — SIMETHICONE 80 MG PO CHEW
80.0000 mg | CHEWABLE_TABLET | Freq: Three times a day (TID) | ORAL | Status: DC
  Administered 2024-04-23 – 2024-04-25 (×7): 80 mg via ORAL
  Filled 2024-04-23 (×7): qty 1

## 2024-04-23 MED ORDER — TERBUTALINE SULFATE 1 MG/ML IJ SOLN
0.2500 mg | Freq: Once | INTRAMUSCULAR | Status: AC | PRN
  Administered 2024-04-23: 0.25 mg via SUBCUTANEOUS
  Filled 2024-04-23: qty 1

## 2024-04-23 MED ORDER — ACETAMINOPHEN 500 MG PO TABS
1000.0000 mg | ORAL_TABLET | Freq: Four times a day (QID) | ORAL | Status: DC
  Administered 2024-04-23 – 2024-04-25 (×7): 1000 mg via ORAL
  Filled 2024-04-23 (×8): qty 2

## 2024-04-23 MED ORDER — PROPOFOL 10 MG/ML IV BOLUS
INTRAVENOUS | Status: AC
  Filled 2024-04-23: qty 20

## 2024-04-23 MED ORDER — OXYTOCIN-SODIUM CHLORIDE 30-0.9 UT/500ML-% IV SOLN: 2.5000 [IU]/h | INTRAVENOUS | Status: DC

## 2024-04-23 MED ORDER — ZOLPIDEM TARTRATE 5 MG PO TABS
5.0000 mg | ORAL_TABLET | Freq: Every evening | ORAL | Status: DC | PRN
Start: 2024-04-23 — End: 2024-04-25

## 2024-04-23 MED ORDER — FENTANYL CITRATE (PF) 100 MCG/2ML IJ SOLN: 100.0000 ug | INTRAMUSCULAR | Status: DC | PRN

## 2024-04-23 MED ORDER — HYDROMORPHONE HCL 2 MG PO TABS
2.0000 mg | ORAL_TABLET | ORAL | Status: DC | PRN
  Administered 2024-04-23 – 2024-04-24 (×2): 2 mg via ORAL
  Filled 2024-04-23 (×2): qty 1

## 2024-04-23 MED ORDER — HYDROMORPHONE HCL 1 MG/ML IJ SOLN
INTRAMUSCULAR | Status: DC | PRN
  Administered 2024-04-23: .5 mg via INTRAVENOUS
  Administered 2024-04-23: 1 mg via INTRAVENOUS
  Administered 2024-04-23: .5 mg via INTRAVENOUS

## 2024-04-23 MED ORDER — INFLUENZA VIRUS VACC SPLIT PF (FLUZONE) 0.5 ML IM SUSY: 0.5000 mL | PREFILLED_SYRINGE | INTRAMUSCULAR | Status: DC

## 2024-04-23 MED ORDER — OXYCODONE HCL 5 MG PO TABS
5.0000 mg | ORAL_TABLET | ORAL | Status: DC | PRN
  Administered 2024-04-23: 5 mg via ORAL
  Filled 2024-04-23: qty 1

## 2024-04-23 MED ORDER — EPHEDRINE SULFATE-NACL 50-0.9 MG/10ML-% IV SOSY
PREFILLED_SYRINGE | INTRAVENOUS | Status: DC | PRN
  Administered 2024-04-23: 10 mg via INTRAVENOUS

## 2024-04-23 MED ORDER — DIBUCAINE (PERIANAL) 1 % EX OINT
1.0000 | TOPICAL_OINTMENT | CUTANEOUS | Status: DC | PRN
Start: 2024-04-23 — End: 2024-04-25

## 2024-04-23 MED ORDER — DROPERIDOL 2.5 MG/ML IJ SOLN: 0.6250 mg | Freq: Once | INTRAMUSCULAR | Status: DC | PRN

## 2024-04-23 MED ORDER — SODIUM CHLORIDE 0.9 % IR SOLN
Status: DC | PRN
  Administered 2024-04-23: 1000 mL

## 2024-04-23 MED ORDER — KETOROLAC TROMETHAMINE 30 MG/ML IJ SOLN
INTRAMUSCULAR | Status: DC | PRN
Start: 2024-04-23 — End: 2024-04-23
  Administered 2024-04-23: 30 mg via INTRAVENOUS

## 2024-04-23 MED ORDER — SENNOSIDES-DOCUSATE SODIUM 8.6-50 MG PO TABS
2.0000 | ORAL_TABLET | ORAL | Status: DC
  Administered 2024-04-23 – 2024-04-25 (×3): 2 via ORAL
  Filled 2024-04-23 (×3): qty 2

## 2024-04-23 MED ORDER — DEXAMETHASONE SOD PHOSPHATE PF 10 MG/ML IJ SOLN
INTRAMUSCULAR | Status: DC | PRN
  Administered 2024-04-23: 10 mg via INTRAVENOUS

## 2024-04-23 MED ORDER — ONDANSETRON HCL 4 MG/2ML IJ SOLN
4.0000 mg | Freq: Four times a day (QID) | INTRAMUSCULAR | Status: DC | PRN
  Administered 2024-04-23: 4 mg via INTRAVENOUS

## 2024-04-23 MED ORDER — LACTATED RINGERS IV SOLN: INTRAVENOUS | Status: DC | PRN

## 2024-04-23 SURGICAL SUPPLY — 30 items
BENZOIN TINCTURE PRP APPL 2/3 (GAUZE/BANDAGES/DRESSINGS) ×2 IMPLANT
CHLORAPREP W/TINT 26 (MISCELLANEOUS) ×4 IMPLANT
CLAMP UMBILICAL CORD (MISCELLANEOUS) ×2 IMPLANT
CLOTH BEACON ORANGE TIMEOUT ST (SAFETY) ×2 IMPLANT
DERMABOND ADVANCED .7 DNX12 (GAUZE/BANDAGES/DRESSINGS) IMPLANT
DRSG OPSITE POSTOP 4X10 (GAUZE/BANDAGES/DRESSINGS) ×2 IMPLANT
ELECTRODE REM PT RTRN 9FT ADLT (ELECTROSURGICAL) ×2 IMPLANT
EXTRACTOR VACUUM M CUP 4 TUBE (SUCTIONS) IMPLANT
GAUZE PAD ABD 8X10 STRL (GAUZE/BANDAGES/DRESSINGS) IMPLANT
GAUZE SPONGE 4X4 12PLY STRL (GAUZE/BANDAGES/DRESSINGS) IMPLANT
GLOVE BIOGEL PI IND STRL 7.0 (GLOVE) ×4 IMPLANT
GLOVE BIOGEL PI IND STRL 7.5 (GLOVE) ×4 IMPLANT
GLOVE ECLIPSE 7.5 STRL STRAW (GLOVE) ×2 IMPLANT
GOWN STRL REUS W/TWL LRG LVL3 (GOWN DISPOSABLE) ×6 IMPLANT
KIT ABG SYR 3ML LUER SLIP (SYRINGE) IMPLANT
MAT PREVALON FULL STRYKER (MISCELLANEOUS) IMPLANT
NDL HYPO 25X5/8 SAFETYGLIDE (NEEDLE) IMPLANT
NEEDLE HYPO 25X5/8 SAFETYGLIDE (NEEDLE) IMPLANT
NS IRRIG 1000ML POUR BTL (IV SOLUTION) ×2 IMPLANT
PACK C SECTION WH (CUSTOM PROCEDURE TRAY) ×2 IMPLANT
PAD OB MATERNITY 4.3X12.25 (PERSONAL CARE ITEMS) ×2 IMPLANT
RTRCTR C-SECT PINK 25CM LRG (MISCELLANEOUS) ×2 IMPLANT
STRIP CLOSURE SKIN 1/2X4 (GAUZE/BANDAGES/DRESSINGS) ×2 IMPLANT
SUT VIC AB 0 CT1 36 (SUTURE) ×6 IMPLANT
SUT VIC AB 2-0 CT1 TAPERPNT 27 (SUTURE) ×2 IMPLANT
SUT VIC AB 4-0 KS 27 (SUTURE) ×2 IMPLANT
TAPE PAPER 1X10 WHT MICROPORE (GAUZE/BANDAGES/DRESSINGS) IMPLANT
TOWEL OR 17X24 6PK STRL BLUE (TOWEL DISPOSABLE) ×2 IMPLANT
TRAY FOLEY W/BAG SLVR 14FR LF (SET/KITS/TRAYS/PACK) ×2 IMPLANT
WATER STERILE IRR 1000ML POUR (IV SOLUTION) ×2 IMPLANT

## 2024-04-23 NOTE — Anesthesia Preprocedure Evaluation (Signed)
 Anesthesia Evaluation  Patient identified by MRN, date of birth, ID band Patient awake    Reviewed: Allergy & Precautions, Patient's Chart, lab work & pertinent test resultsPreop documentation limited or incomplete due to emergent nature of procedure.  Airway Mallampati: I  TM Distance: >3 FB Neck ROM: Full    Dental no notable dental hx.    Pulmonary neg pulmonary ROS   Pulmonary exam normal        Cardiovascular negative cardio ROS  Rhythm:Regular Rate:Tachycardia     Neuro/Psych negative neurological ROS  negative psych ROS   GI/Hepatic negative GI ROS, Neg liver ROS,,,  Endo/Other  negative endocrine ROS    Renal/GU negative Renal ROS  negative genitourinary   Musculoskeletal   Abdominal   Peds  Hematology  (+) Blood dyscrasia, anemia Lab Results      Component                Value               Date                      WBC                      6.5                 04/23/2024                HGB                      11.3 (L)            04/23/2024                HCT                      34.1 (L)            04/23/2024                MCV                      79.9 (L)            04/23/2024                PLT                      140 (L)             04/23/2024              Anesthesia Other Findings   Reproductive/Obstetrics (+) Pregnancy                              Anesthesia Physical Anesthesia Plan  ASA: 2 and emergent  Anesthesia Plan: General   Post-op Pain Management:    Induction: Intravenous, Rapid sequence and Cricoid pressure planned  PONV Risk Score and Plan: 3 and Ondansetron , Dexamethasone and Midazolam  Airway Management Planned: Mask and Oral ETT  Additional Equipment: None  Intra-op Plan:   Post-operative Plan: Extubation in OR  Informed Consent: I have reviewed the patients History and Physical, chart, labs and discussed the procedure including the risks,  benefits and alternatives for the proposed anesthesia with the patient or authorized representative who has indicated his/her understanding and acceptance.  Dental advisory given  Plan Discussed with: CRNA  Anesthesia Plan Comments:         Anesthesia Quick Evaluation

## 2024-04-23 NOTE — Anesthesia Procedure Notes (Addendum)
 Procedure Name: Intubation Date/Time: 04/23/2024 10:55 AM  Performed by: Richarda Silvano HERO, CRNAPre-anesthesia Checklist: Patient identified, Emergency Drugs available, Suction available, Patient being monitored and Timeout performed Patient Re-evaluated:Patient Re-evaluated prior to induction Oxygen Delivery Method: Circle system utilized Preoxygenation: Pre-oxygenation with 100% oxygen Induction Type: Rapid sequence and IV induction Laryngoscope Size: Mac, 3 and Glidescope Grade View: Grade I Tube type: Oral Tube size: 7.0 mm Number of attempts: 1 Airway Equipment and Method: Stylet Placement Confirmation: ETT inserted through vocal cords under direct vision, positive ETCO2 and breath sounds checked- equal and bilateral Secured at: 21 cm Tube secured with: Tape Dental Injury: Teeth and Oropharynx as per pre-operative assessment  Comments: Atraumatic RSI, Grade I view with glidescope, ETCO2 confirmed, BBSE by Dr Dorethea.

## 2024-04-23 NOTE — Op Note (Signed)
 Cesarean Section Operative Report  Mary Vincent  04/23/2024  Indications: fetal indications (prolonged deceleration remote from delivery)  Pre-operative Diagnosis: primary low transverse cesarean section  Post-operative Diagnosis: primary low transverse cesarean section, immediate postpartum hemorrhage  Surgeon: Surgeons and Role:    * Alycia Cooperwood, Devaughn Sayres, MD - Primary    * Danny Geralds, DO - Fellow   Attending Attestation: I was present and scrubbed for the entire procedure.   An experienced assistant was required given the standard of surgical care given the complexity of the case.  This assistant was needed for exposure, dissection, suctioning, retraction, instrument exchange, assisting with delivery with administration of fundal pressure, and for overall help during the procedure.  Anesthesia: general    Quantified Blood Loss: 1263 ml  Total IV Fluids: 700 ml LR  Urine Output:: 75 ml clear yellow urine  Specimens: none  Findings: Viable female infant in cephalic presentation; Apgars pending; weight pending; arterial cord pH obtained but appears it was never processed;  clear amniotic fluid; intact placenta with three vessel cord.  Baby condition / location:  Nursery   Complications: immediate postpartum hemorrhage secondary to atony  Indications: Mary Vincent is a 23 y.o. [phone] with an IUP [redacted]w[redacted]d presenting for IOL for FGR.   Reactive NST on admission.  A short while after receiving first dose of Cytotec , bradycardic to 50s/60s, this did not resolve with position change and terbutaline . At the 10 minute mark fetal heart rate 70 and was not showing signs of recover. Patient was then verbally consented for emergency cesarean section. 10:48 AM was the time we called for an emergency cesarean, delivery time was 10:58 AM.   Procedure Details:  The patient was taken back to the operative suite.   A time out was held and the above information confirmed.   The patient  prepped with betadine and draped and placed in a dorsal supine position with a leftward tilt. General anesthesia was then induced. A Pfannenstiel incision was made and carried down through the subcutaneous tissue to the fascia. Fascial incision was made and bluntly extended transversely. The fascia was separated from the underlying rectus tissue superiorly and inferiorly. The peritoneum was identified and bluntly entered and extended longitudinally. Alexis retractor was placed. A bladder flap was not created. A low transverse uterine incision was made and extended bluntly. Delivered from cephalic presentation was a viable infant with Apgars and weight as above.  After not waiting for delayed cord cutting, the umbilical cord was clamped and cut cord blood was obtained for evaluation. Cord ph was sent. The placenta was removed Intact and appeared normal. The uterine outline, tubes and ovaries appeared normal. Poor tone and greater than average bleeding noted. TXA already administered prophylactically, pitocin  was continued and methergine was ordered. The uterine incision was closed with running unlocked sutures 0-Vicryl in one layer.   Hemostasis was observed after placement of one 0 vicryl figure of eight suture. Tone noted to be good. The peritoneum was closed with 2-0 vicryl. The rectus muscles were examined and hemostasis observed. The fascia was then reapproximated with running sutures of 0-Vicryl. Before full closure of fascia, x-ray was performed as we did not have a count prior to the start of the case - no retained foreign body identified.  The skin was closed with 4-0 Vicryl.  Instrument, sponge, and needle counts were correct prior the abdominal closure and were correct at the conclusion of the case.     Disposition: PACU - hemodynamically stable.  Maternal Condition: stable       Signed: Devaughn KATHEE Ban, MD 04/23/2024 12:17 PM

## 2024-04-23 NOTE — OR Nursing (Signed)
 Xray here to take xray since no counts were done

## 2024-04-23 NOTE — Discharge Summary (Signed)
 Postpartum Discharge Summary  Date of Service updated***     Patient Name: Mary Vincent DOB: 2001-06-03 MRN: [id number]  Date of admission: 04/23/2024 Delivery date:04/23/2024 Delivering provider: KANDIS ASA BEDFORD Date of discharge: 04/23/2024  Admitting diagnosis: IUGR (intrauterine growth restriction) affecting care of mother [O36.5990] Intrauterine pregnancy: [redacted]w[redacted]d     Secondary diagnosis:  Principal Problem:   IUGR (intrauterine growth restriction) affecting care of mother Active Problems:   Rubella non-immune status, antepartum   Postpartum hemorrhage  Additional problems: ***    Discharge diagnosis: Term Pregnancy Delivered and PPH                                              Post partum procedures:{Postpartum procedures:23558} Augmentation: AROM and Cytotec  Complications: Hemorrhage>1029mL  Hospital course: Induction of Labor With Cesarean Section   23 y.o. yo [phone] at [redacted]w[redacted]d was admitted to the hospital 04/23/2024 for induction of labor. Patient had a labor course significant for NRFHT/prolonged deceleration. The patient went for cesarean section due to Non-Reassuring FHR. Delivery details are as follows: Membrane Rupture Time/Date: 10:58 AM,04/23/2024  Delivery Method:C-Section, Low Transverse Operative Delivery:N/A Details of operation can be found in separate operative Note.    Patient had a postpartum course complicated by***. She is ambulating, tolerating a regular diet, passing flatus, and urinating well.  Patient is discharged home in stable condition on 04/23/24.      Newborn Data: Birth date:04/23/2024 Birth time:10:58 AM Gender:Female Living status:Living Apgars:7 ,7  Weight:2970 g                               Magnesium Sulfate received: {Mag received:30440022} BMZ received: No Rhophylac:N/A FFM:{FFM:69559966} T-DaP:Given prenatally Flu: {Qol:76036} RSV Vaccine received: {RSV:31013} Transfusion:No  Immunizations received: Immunization  History  Administered Date(s) Administered   HPV Quadrivalent 07/12/2012, 02/06/2013   PFIZER(Purple Top)SARS-COV-2 Vaccination 04/29/2020   PPD Test 03/07/2020   Td 02/17/2010   Tdap 02/17/2010, 10/23/2020, 01/12/2023, 09/13/2023, 04/09/2024    Physical exam  Vitals:   04/23/24 0951 04/23/24 1012 04/23/24 1235 04/23/24 1245  BP: (!) 83/47 99/67 113/73 119/70  Pulse: 72 79 (!) 122 (!) 103  Resp:   16 13  Temp:   97.8 F (36.6 C)   TempSrc:   Oral   SpO2:    100%  Weight:      Height:       General: {Exam; general:21111117} Lochia: {Desc; appropriate/inappropriate:30686::appropriate} Uterine Fundus: {Desc; firm/soft:30687} Incision: {Exam; incision:21111123} DVT Evaluation: {Exam; dvt:2111122}  Labs: Lab Results  Component Value Date   WBC 6.5 04/23/2024   HGB 11.3 (L) 04/23/2024   HCT 34.1 (L) 04/23/2024   MCV 79.9 (L) 04/23/2024   PLT 140 (L) 04/23/2024      Latest Ref Rng & Units 09/13/2023    4:08 PM  CMP  Glucose 70 - 99 mg/dL 897   BUN 6 - 20 mg/dL 15   Creatinine 9.55 - 1.00 mg/dL 9.09   Sodium 864 - 854 mmol/L 135   Potassium 3.5 - 5.1 mmol/L 4.3   Chloride 98 - 111 mmol/L 103    Edinburgh Score:    03/28/2023    4:28 PM  Edinburgh Postnatal Depression Scale Screening Tool  I have been able to laugh and see the funny side of things. 0   I have looked  forward with enjoyment to things. 1   I have blamed myself unnecessarily when things went wrong. 0   I have been anxious or worried for no good reason. 0   I have felt scared or panicky for no good reason. 0   Things have been getting on top of me. 0   I have been so unhappy that I have had difficulty sleeping. 0   I have felt sad or miserable. 1   I have been so unhappy that I have been crying. 0   The thought of harming myself has occurred to me. 0   Edinburgh Postnatal Depression Scale Total 2      Data saved with a previous flowsheet row definition   No data recorded  After visit meds:   Allergies as of 04/23/2024   No Known Allergies   Med Rec must be completed prior to using this [facility]***        Discharge home in stable condition Infant Feeding: {Baby feeding:23562} Infant Disposition:{CHL IP OB HOME WITH FNUYZM:76418} Discharge instruction: per After Visit Summary and Postpartum booklet. Activity: Advance as tolerated. Pelvic rest for 6 weeks.  Diet: {OB ipzu:78888878} Future Appointments:No future appointments. Follow up Visit: Message sent 11/17  Please schedule this patient for a In person postpartum visit in 6 weeks with the following provider: Any provider. Additional Postpartum F/U:Incision check 1 week  Low risk pregnancy complicated by: none Delivery mode:  C-Section, Low Transverse Anticipated Birth Control:  Unsure   04/23/2024 Barabara Maier, DO

## 2024-04-23 NOTE — Lactation Note (Signed)
 This note was copied from a baby's chart. Lactation Consultation Note  Patient Name: Mary Vincent Date: 04/23/2024 Age:25 hours Reason for consult: Follow-up assessment;Early term 37-38.6wks;Breastfeeding assistance  P3- LC came into the room as previously discussed with MOB. Infant was latched to the right breast in the football hold with the help of the MGM. Infant had flanged lips and a strong rhythmic suck. LC praised MOB, infant and the MGM. MOB denies having any discomfort. LC encouraged MOB to call for further assistance as needed. Infant was still nursing when [facility] left the room.  Maternal Data Has patient been taught Hand Expression?: No Does the patient have breastfeeding experience prior to this delivery?: Yes How long did the patient breastfeed?: a few days with both older children, reported that she couldn't figure out how to latch them  Feeding Mother's Current Feeding Choice: Breast Milk and Formula Nipple Type: Slow - flow  LATCH Score Latch: Grasps breast easily, tongue down, lips flanged, rhythmical sucking.  Audible Swallowing: Spontaneous and intermittent  Type of Nipple: Everted at rest and after stimulation  Comfort (Breast/Nipple): Soft / non-tender  Hold (Positioning): No assistance needed to correctly position infant at breast.  LATCH Score: 10   Lactation Tools Discussed/Used Pump Education: Milk Storage  Interventions Interventions: Breast feeding basics reviewed;Education;LC Services brochure  Discharge Discharge Education: Engorgement and breast care;Warning signs for feeding baby Pump: Hands Free;Personal;Referral sent for [facility] Pump  Consult Status Consult Status: Follow-up Date: 04/24/24 Follow-up type: In-patient    Recardo Hoit BS, IBCLC 04/23/2024, 8:03 PM

## 2024-04-23 NOTE — Anesthesia Postprocedure Evaluation (Signed)
 Anesthesia Post Note  Patient: Art Gallery Manager  Procedure(s) Performed: CESAREAN DELIVERY (Abdomen)     Patient location during evaluation: PACU Anesthesia Type: General Level of consciousness: awake and alert Pain management: pain level controlled Vital Signs Assessment: post-procedure vital signs reviewed and stable Respiratory status: spontaneous breathing, nonlabored ventilation, respiratory function stable and patient connected to nasal cannula oxygen Cardiovascular status: blood pressure returned to baseline and stable Postop Assessment: no apparent nausea or vomiting Anesthetic complications: no   No notable events documented.  Last Vitals:  Vitals:   04/23/24 1417 04/23/24 1537  BP: 102/64 100/66  Pulse: 83   Resp: 16 16  Temp: 37.1 C   SpO2: 100% 100%    Last Pain:  Vitals:   04/23/24 1537  TempSrc:   PainSc: 2                  Cordella SQUIBB Delance Weide

## 2024-04-23 NOTE — Lactation Note (Signed)
 This note was copied from a baby's chart. Lactation Consultation Note  Patient Name: Mary Vincent Date: 04/23/2024 Age:25 hours Reason for consult: Initial assessment;Early term 37-38.6wks;Breastfeeding assistance;Mother's request  P3- MOB requested latching assistance. Infant had just finished 20 mL of formula, so LC warned MOB that infant may not be very interested on the breast at this time. LC placed infant on the right breast in the football hold. Infant latched with first attempt. Infant had flanged lips, but a weak suck. Infant nursed on and off for 5 minutes before falling asleep. LC encouraged trying the breast first for infant's next feeding. MOB agreed. LC will assist with infant's next feeding at 2000.  LC reviewed the first 24 hr birthday nap, day 2 cluster feeding, not allowing infant to go over 3 hrs without a feeding, CDC milk storage guidelines, LC services handout and engorgement/breast care. LC encouraged MOB to call for further assistance as needed. LC sent [facility] referral.  Maternal Data Has patient been taught Hand Expression?: No Does the patient have breastfeeding experience prior to this delivery?: Yes How long did the patient breastfeed?: a few days with both older children, reported that she couldn't figure out how to latch them  Feeding Mother's Current Feeding Choice: Breast Milk and Formula Nipple Type: Slow - flow  LATCH Score Latch: Repeated attempts needed to sustain latch, nipple held in mouth throughout feeding, stimulation needed to elicit sucking reflex.  Audible Swallowing: None  Type of Nipple: Everted at rest and after stimulation  Comfort (Breast/Nipple): Soft / non-tender  Hold (Positioning): Full assist, staff holds infant at breast  LATCH Score: 5   Lactation Tools Discussed/Used Pump Education: Milk Storage  Interventions Interventions: Breast feeding basics reviewed;Assisted with latch;Breast compression;Adjust  position;Support pillows;Position options;Education;LC Services brochure  Discharge Discharge Education: Engorgement and breast care;Warning signs for feeding baby Pump: Hands Free;Personal;Referral sent for [facility] Pump  Consult Status Consult Status: Follow-up Date: 04/24/24 Follow-up type: In-patient    Recardo Hoit BS, IBCLC 04/23/2024, 6:29 PM

## 2024-04-23 NOTE — H&P (Signed)
 OBSTETRIC ADMISSION HISTORY AND PHYSICAL  Mary Vincent is a 23 y.o. female [phone] with IUP at [redacted]w[redacted]d by 14 wk US  presenting for IOL. She reports +FMs, No LOF, no VB, no blurry vision, headaches or peripheral edema, and RUQ pain.  She plans on breast and bottle feeding. She request NuvaRing for birth control. She received her prenatal care at [facility]   Dating: By 14wk US  --->  Estimated Date of Delivery: 05/05/24  Sono:    @[redacted]w[redacted]d , normal anatomy, cephalic presentation, posterior, 2413g, 7% EFW   Prenatal History/Complications: FGR, anemia, rubella NI, alpha thal silent carrier  Past Medical History: Past Medical History:  Diagnosis Date   Anemia    Chlamydia    Chlamydia contact, treated    Ovarian cyst    Trichomonas infection     Past Surgical History: Past Surgical History:  Procedure Laterality Date   WISDOM TOOTH EXTRACTION      Obstetrical History: OB History     Gravida  4   Para  2   Term  2   Preterm  0   AB  1   Living  2      SAB  0   IAB  1   Ectopic  0   Multiple  0   Live Births  2           Social History Social History   Socioeconomic History   Marital status: Single    Spouse name: Not on file   Number of children: Not on file   Years of education: Not on file   Highest education level: Not on file  Occupational History   Not on file  Tobacco Use   Smoking status: Never   Smokeless tobacco: Never  Vaping Use   Vaping status: Never Used  Substance and Sexual Activity   Alcohol use: Not Currently    Comment: weekends   Drug use: Not Currently    Types: Marijuana    Comment: last used April 2025   Sexual activity: Yes    Birth control/protection: None  Other Topics Concern   Not on file  Social History Narrative   Not on file   Social Drivers of Health   Financial Resource Strain: Not on file  Food Insecurity: No Food Insecurity (02/28/2023)   Hunger Vital Sign    Worried About Running Out of Food in the Last  Year: Never true    Ran Out of Food in the Last Year: Never true  Transportation Needs: No Transportation Needs (02/28/2023)   PRAPARE - Administrator, Civil Service (Medical): No    Lack of Transportation (Non-Medical): No  Physical Activity: Not on file  Stress: Not on file  Social Connections: Not on file    Family History: Family History  Problem Relation Age of Onset   Healthy Mother    Healthy Father    Hypertension Maternal Grandmother    Asthma Neg Hx    Cancer Neg Hx    Diabetes Neg Hx    Heart disease Neg Hx     Allergies: No Known Allergies  No medications prior to admission.     Review of Systems   All systems reviewed and negative except as stated in HPI  unknown if currently breastfeeding. General appearance: alert, cooperative, and appears stated age Lungs: clear to auscultation bilaterally Heart: regular rate and rhythm Abdomen: soft, non-tender; bowel sounds normal Pelvic: 1.5/long/high Extremities: war, Presentation: cephalic Fetal monitoring cat 1 Uterine activityNone  Prenatal labs: ABO, Rh: O/Positive/-- (07/02 1125) Antibody: Negative (07/02 1125) Rubella: <0.90 (07/02 1125) RPR: Non Reactive (09/03 1014)  HBsAg: Negative (07/02 1125)  HIV: Non Reactive (09/03 1014)  GBS: Negative/-- (11/03 1117)    Lab Results  Component Value Date   GBS Negative 04/09/2024   Immunization History  Administered Date(s) Administered   HPV Quadrivalent 07/12/2012, 02/06/2013   PFIZER(Purple Top)SARS-COV-2 Vaccination 04/29/2020   PPD Test 03/07/2020   Td 02/17/2010   Tdap 02/17/2010, 10/23/2020, 01/12/2023, 09/13/2023, 04/09/2024    Prenatal Transfer Tool  Maternal Diabetes: No Genetic Screening: Normal Maternal Ultrasounds/Referrals: IUGR Fetal Ultrasounds or other Referrals:  None Maternal Substance Abuse:  No Significant Maternal Medications:  None Significant Maternal Lab Results: Group B Strep negative Number of  Prenatal Visits:greater than 3 verified prenatal visits Maternal Vaccinations:TDap and Covid Other Comments:  None        NURSING  PROVIDER  Conservator, [facility] Dating by [facility] Model Traditional Anatomy U/S 9th%tile  Initiated care at  wks                 Language  English               LAB RESULTS   Support Person FOB Genetics NIPS: low risk female AFP: normal      NT/IT (FT only)        Carrier Screen Horizon: Silent Carrier for Unumprovident  Rhogam  --/--/O POS (09/23 2233) A1C/GTT Early HgbA1C:  Third trimester 2 hr GTT: normal  Flu Vaccine Declined       TDaP Vaccine  04/09/24 Blood Type --/--/O POS (09/23 2233)  RSV Vaccine declined Antibody NEG (09/23 2233)  COVID Vaccine   Rubella <0.90 (05/22 1138)  Feeding Plan both RPR NON REACTIVE (09/23 2234)  Contraception Nuva-Ring  HBsAg Negative (05/22 1138)  Circumcision If boy Yes  HIV Non Reactive (07/10 1000)  Pediatrician  Looking into [facility]  HCVAb Non Reactive (05/22 1138)  Prenatal Classes        BTL Consent   Pap       Diagnosis  Date Value Ref Range Status  07/01/2020     Final    - Negative for intraepithelial lesion or malignancy (NILM)    BTL Pre-payment   GC/CT Initial:   36wks:    VBAC Consent   GBS Negative/-- (09/05 9191) For PCN allergy, check sensitivities   BRx Optimized? [ ]  yes   [ ]  no      DME Rx [ ]  BP cuff [ ]  Weight Scale Waterbirth  [ ]  Class [ ]  Consent [ ]  CNM visit  PHQ9 & GAD7 [  ] new OB [  ] 28 weeks  [  ] 36 weeks Induction  [ ]  Orders Entered [ ] Foley Y/N     No results found for this or any previous visit (from the past 24 hours).  Patient Active Problem List   Diagnosis Date Noted   Anemia complicating pregnancy in third trimester 03/02/2024   IUGR (intrauterine growth restriction) affecting care of mother 03/01/2024   Supervision of other normal pregnancy, antepartum 10/18/2023   Alpha thalassemia silent carrier 10/28/2022   Rubella non-immune status,  antepartum 07/03/2020    Assessment/Plan:  Mary Vincent is a 23 y.o. H5E7987 at [redacted]w[redacted]d here for IOL for IUGR (7% EFW, normal dopplers)  #Labor: unable to pass foley, will begin with Cytotec  #Pain: IV pain meds for now #FWB: Cat 1, nst  reactive #GBS status:  negative #Feeding: Breastmilk  and Formula #Reproductive Life planning: nuvaring #Circ:  yes  Leeroy KATHEE Pouch, MD  04/23/2024, 12:36 AM

## 2024-04-23 NOTE — Transfer of Care (Signed)
 Immediate Anesthesia Transfer of Care Note  Patient: Mary Vincent  Procedure(s) Performed: CESAREAN DELIVERY (Abdomen)  Patient Location: PACU  Anesthesia Type:General  Level of Consciousness: awake, alert , and oriented  Airway & Oxygen Therapy: Patient Spontanous Breathing  Post-op Assessment: Report given to RN and Post -op Vital signs reviewed and stable  Post vital signs: Reviewed and stable  Last Vitals:  Vitals Value Taken Time  BP 113/73 04/23/24 12:34  Temp    Pulse 101 04/23/24 12:40  Resp 13 04/23/24 12:40  SpO2 100 % 04/23/24 12:40  Vitals shown include unfiled device data.  Last Pain:  Vitals:   04/23/24 1012  TempSrc:   PainSc: 0-No pain         Complications: No notable events documented.

## 2024-04-23 NOTE — OR Nursing (Signed)
 Dr Robynn confirmed clear xray

## 2024-04-24 ENCOUNTER — Encounter (HOSPITAL_COMMUNITY): Payer: Self-pay | Admitting: Obstetrics and Gynecology

## 2024-04-24 LAB — CBC
HCT: 23.1 % — ABNORMAL LOW (ref 36.0–46.0)
Hemoglobin: 7.8 g/dL — ABNORMAL LOW (ref 12.0–15.0)
MCH: 27.1 pg (ref 26.0–34.0)
MCHC: 33.8 g/dL (ref 30.0–36.0)
MCV: 80.2 fL (ref 80.0–100.0)
Platelets: 149 K/uL — ABNORMAL LOW (ref 150–400)
RBC: 2.88 MIL/uL — ABNORMAL LOW (ref 3.87–5.11)
RDW: 19.9 % — ABNORMAL HIGH (ref 11.5–15.5)
WBC: 16.7 K/uL — ABNORMAL HIGH (ref 4.0–10.5)
nRBC: 0 % (ref 0.0–0.2)

## 2024-04-24 MED ORDER — IRON SUCROSE 200 MG IVPB - SIMPLE MED: 200.0000 mg | Freq: Once | Status: DC

## 2024-04-24 MED ORDER — SODIUM CHLORIDE 0.9 % IV SOLN
500.0000 mg | Freq: Once | INTRAVENOUS | Status: AC
  Administered 2024-04-24: 500 mg via INTRAVENOUS
  Filled 2024-04-24: qty 25

## 2024-04-24 NOTE — Progress Notes (Addendum)
 POSTPARTUM PROGRESS NOTE  Post-Op Day 1  Subjective:  Mary Vincent is a 23 y.o. [id number] s/p STAT c/s for fetal indication following IOL at [redacted]w[redacted]d.  No acute events overnight.  Pt denies problems with ambulating, voiding or po intake.  She denies nausea or vomiting.  Pain is moderately controlled with medication.  She has not had flatus. She has not had bowel movement.  Lochia Small. Asymptomatic without dyspnea, dizziness, or chest pain. Reports mood is good.  Objective: Blood pressure 102/75, pulse 88, temperature 98.6 F (37 C), temperature source Oral, resp. rate 18, height 5' 5 (1.651 m), weight 66.2 kg, SpO2 100%, unknown if currently breastfeeding.  Physical Exam:  General: alert, cooperative and no distress Chest: no respiratory distress Heart:regular rate, distal pulses intact Abdomen: soft, nontender,  Uterine Fundus: firm, appropriately tender, below the level of the umbilicus DVT Evaluation: No calf swelling or tenderness Extremities: no lower extremity edema Skin: warm, dry; overlying dressing clean/dry/intact without   Recent Labs    04/23/24 0836 04/24/24 0458  HGB 11.3* 7.8*  HCT 34.1* 23.1*    Assessment/Plan: Mary Vincent is a 23 y.o. [id number] s/p STAT c/s at [redacted]w[redacted]d   PPD#1 - Doing well, POD1 hemoglobin 7.8 from 11.3. Patient asymptomatic, will give IV iron. Pt unsure if she is passing flatus, no abdominal pain, tolerating PO and reassuring exam. Encouraged ambulation.  Contraception: Patch Feeding: Breast and formula feeding Dispo: Plan for discharge anticipate discharge on POD3.   LOS: 1 day   Jess Reymundo BRAVO, MD 04/24/2024, 7:40 AM  Family Medicine PGY-3  Attestation of Supervision of Student:  I confirm that I have verified the information documented in the resident's note and that I have also personally reperformed the history, physical exam and all medical decision making activities.  I have verified that all services and findings are  accurately documented in this student's note; and I agree with management and plan as outlined in the documentation. I have also made any necessary editorial changes.   Barkley LITTIE Angles, MD OB Fellow 04/24/2024 10:00 AM

## 2024-04-24 NOTE — Lactation Note (Signed)
 This note was copied from a baby's chart. Lactation Consultation Note  Patient Name: Mary Vincent Date: 04/24/2024 Age:25 hours, P3  Reason for consult: Follow-up assessment;Early term 37-38.6wks;Infant weight loss;Nipple pain/trauma Per mom has been given bottles today due to sore nipples.  LC offered to assist to see if the baby would latch and mom receptive.  LC provided adequate pillow support and was on/ and off at 1st and then latched with swallows .  LC recommended since the baby has gotten use to the flow if the bottle, prior to latching, may have to feed the baby and appetizer and then latch. Also recommended with the hand pump provided , hand express, pre-pump with the hand pump and reverse pressure as shown to obtain a deep latch consistently.  LC reassured mom once the milk is flowing more latching will improve and the baby will sustain the latch longer.  See below for details.   Maternal Data Does the patient have breastfeeding experience prior to this delivery?: Yes  Feeding Mother's Current Feeding Choice: Breast Milk and Formula Nipple Type: Slow - flow  LATCH Score Latch: Repeated attempts needed to sustain latch, nipple held in mouth throughout feeding, stimulation needed to elicit sucking reflex.  Audible Swallowing: Spontaneous and intermittent  Type of Nipple: Everted at rest and after stimulation  Comfort (Breast/Nipple): Soft / non-tender  Hold (Positioning): Assistance needed to correctly position infant at breast and maintain latch.  LATCH Score: 8   Lactation Tools Discussed/Used Tools: Pump;Flanges;Shells Flange Size: 18;21 Breast pump type: Manual Pump Education: Setup, frequency, and cleaning;Milk Storage Reason for Pumping: LC recommended steps for latching due to mom mentioning nipples are sore, ( no breakdown) and baby has had several bottles and has gotten use to the it.  Interventions Interventions: Breast feeding basics  reviewed;Assisted with latch;Skin to skin;Breast massage;Hand express;Pre-pump if needed;Reverse pressure;Breast compression;Adjust position;Support pillows;Position options;Shells;Hand pump;Education;LC Services brochure;CDC milk storage guidelines;CDC Guidelines for Breast Pump Cleaning  Discharge Pump: Manual;Received Stork Pump;Personal (Spectra )  Consult Status Consult Status: Follow-up Date: 04/25/24 Follow-up type: In-patient    Rollene Caldron Eulogio Requena 04/24/2024, 5:22 PM

## 2024-04-25 ENCOUNTER — Other Ambulatory Visit (HOSPITAL_COMMUNITY): Payer: Self-pay

## 2024-04-25 LAB — BIRTH TISSUE RECOVERY COLLECTION (PLACENTA DONATION)

## 2024-04-25 MED ORDER — OXYCODONE HCL 5 MG PO TABS
5.0000 mg | ORAL_TABLET | ORAL | 0 refills | Status: AC | PRN
  Filled 2024-04-25: qty 10, 1d supply, fill #0

## 2024-04-25 MED ORDER — SENNOSIDES-DOCUSATE SODIUM 8.6-50 MG PO TABS
2.0000 | ORAL_TABLET | ORAL | 0 refills | Status: AC
  Filled 2024-04-25: qty 30, 15d supply, fill #0

## 2024-04-25 MED ORDER — IBUPROFEN 600 MG PO TABS
600.0000 mg | ORAL_TABLET | Freq: Four times a day (QID) | ORAL | 0 refills | Status: AC
  Filled 2024-04-25: qty 30, 8d supply, fill #0

## 2024-04-25 MED ORDER — COCONUT OIL OIL: 1.0000 | TOPICAL_OIL | Status: AC | PRN

## 2024-04-25 MED ORDER — WITCH HAZEL-GLYCERIN EX PADS: 1.0000 | MEDICATED_PAD | CUTANEOUS | Status: AC | PRN

## 2024-04-25 MED ORDER — ACETAMINOPHEN 500 MG PO TABS
1000.0000 mg | ORAL_TABLET | Freq: Four times a day (QID) | ORAL | 0 refills | Status: AC
  Filled 2024-04-25: qty 30, 4d supply, fill #0

## 2024-04-25 MED ORDER — DIBUCAINE (PERIANAL) 1 % EX OINT: 1.0000 | TOPICAL_OINTMENT | CUTANEOUS | Status: AC | PRN

## 2024-04-25 NOTE — Patient Instructions (Signed)
 If you are interested in an outpatient lactation consultation -- available in-office or virtually -- please reach out to us  at:  [facility] (First Floor) ?? [address], [place], [place]  ?? [phone] Please leave a message on our lactation voicemail box. We welcome any lactation-related questions or concerns -- our team is here to support you and your baby.  Lactation Support Groups Join us  at: [facility] ?? Tuesdays, 10:00 AM - 12:00 PM ?? [address], [place] [employer], [place]  Lactating parents and lap babies are welcome!  ?? [email or web address]  ?? [email or web address] -------------     Mary Vincent, IBCLC [facility]

## 2024-04-25 NOTE — Lactation Note (Signed)
 This note was copied from a baby's chart. Lactation Consultation Note  Patient Name: Mary Vincent Unijb'd Date: 04/25/2024 Age:25 hours, P3  Reason for consult: Follow-up assessment;Early term 37-38.6wks;Infant weight loss;Breastfeeding assistance Per mom baby due to feed.and LC offered to assist with latch, mom receptive. Areola more compressible today and baby latched with depth and was still feeding. Per mom more comfortable today.  LC reminded mom baby needs practice latching and recommended to enhance the milk coming in Latch the baby with cues and by 3 hours, after feeding at the breast , supplement, and then post pump both breast.  Once the breast are fuller, give the baby more time at the breast. LC reviewed breast feeding D/C teaching and the [facility] resources.   Maternal Data Has patient been taught Hand Expression?: Yes  Feeding Mother's Current Feeding Choice: Breast Milk and Formula  LATCH Score Latch: Grasps breast easily, tongue down, lips flanged, rhythmical sucking.  Audible Swallowing: A few with stimulation  Type of Nipple: Everted at rest and after stimulation  Comfort (Breast/Nipple): Soft / non-tender  Hold (Positioning): Assistance needed to correctly position infant at breast and maintain latch.  LATCH Score: 8   Lactation Tools Discussed/Used  Hand pump   Interventions Interventions: Breast feeding basics reviewed;Assisted with latch;Skin to skin;Breast massage;Adjust position;Support pillows  Discharge Discharge Education: Engorgement and breast care;Warning signs for feeding baby Pump: Manual;Received Stork Pump  Consult Status Consult Status: Complete Date: 04/25/24    Mary Vincent 04/25/2024, 8:34 AM

## 2024-04-30 ENCOUNTER — Encounter: Admitting: Obstetrics and Gynecology

## 2024-05-02 ENCOUNTER — Ambulatory Visit

## 2024-05-02 ENCOUNTER — Other Ambulatory Visit: Payer: Self-pay

## 2024-05-02 ENCOUNTER — Other Ambulatory Visit (HOSPITAL_COMMUNITY)
Admission: RE | Admit: 2024-05-02 | Discharge: 2024-05-02 | Disposition: A | Discharge status: Home / Self Care | Source: Ambulatory Visit | Attending: Family Medicine | Admitting: Family Medicine

## 2024-05-02 VITALS — BP 111/79 | HR 55 | Ht 65.0 in | Wt 143.0 lb

## 2024-05-02 DIAGNOSIS — N898 Other specified noninflammatory disorders of vagina: Secondary | ICD-10-CM | POA: Insufficient documentation

## 2024-05-02 NOTE — Progress Notes (Signed)
 Incision Check Visit  Mary Vincent is here for incision check following primary c-section on 04/23/24.   Assessment: Incision looks clean, dry, intact and well approximated. No s/s of infection noted.  Education: Reviewed good daily wound care and s/s of infection with patient.  Patient endorse some vaginal odor for the past few days and request to do a self swab.Self swab instructions given and specimen obtained. Explained patient will be contacted with any abnormal results.   Patient will follow up post partum visit on 06/05/24 at 1515. Patient confirmed scheduled appointment.

## 2024-05-04 LAB — CERVICOVAGINAL ANCILLARY ONLY
Bacterial Vaginitis (gardnerella): POSITIVE — AB
Candida Glabrata: NEGATIVE
Candida Vaginitis: NEGATIVE
Comment: NEGATIVE
Comment: NEGATIVE
Comment: NEGATIVE

## 2024-05-07 ENCOUNTER — Ambulatory Visit: Payer: Self-pay | Admitting: Family Medicine

## 2024-05-07 ENCOUNTER — Encounter: Admitting: Student

## 2024-05-07 MED ORDER — METRONIDAZOLE 500 MG PO TABS: 500.0000 mg | ORAL_TABLET | Freq: Two times a day (BID) | ORAL | 0 refills | Status: AC

## 2024-06-05 ENCOUNTER — Ambulatory Visit: Admitting: Advanced Practice Midwife

## 2024-06-25 ENCOUNTER — Ambulatory Visit: Admitting: Family Medicine
